# Patient Record
Sex: Female | Born: 1935 | Race: White | Hispanic: No
Health system: Southern US, Community
[De-identification: ages and names within clinical notes are randomized; demographics above are authoritative.]

## PROBLEM LIST (undated history)

## (undated) DIAGNOSIS — G629 Polyneuropathy, unspecified: Secondary | ICD-10-CM

## (undated) DIAGNOSIS — D689 Coagulation defect, unspecified: Secondary | ICD-10-CM

## (undated) DIAGNOSIS — I6529 Occlusion and stenosis of unspecified carotid artery: Secondary | ICD-10-CM

## (undated) DIAGNOSIS — K219 Gastro-esophageal reflux disease without esophagitis: Secondary | ICD-10-CM

## (undated) DIAGNOSIS — D369 Benign neoplasm, unspecified site: Secondary | ICD-10-CM

## (undated) DIAGNOSIS — I10 Essential (primary) hypertension: Secondary | ICD-10-CM

## (undated) DIAGNOSIS — M1990 Unspecified osteoarthritis, unspecified site: Secondary | ICD-10-CM

## (undated) DIAGNOSIS — M8580 Other specified disorders of bone density and structure, unspecified site: Secondary | ICD-10-CM

## (undated) DIAGNOSIS — I82409 Acute embolism and thrombosis of unspecified deep veins of unspecified lower extremity: Secondary | ICD-10-CM

## (undated) HISTORY — DX: Polyneuropathy, unspecified: G62.9

## (undated) HISTORY — DX: Unspecified osteoarthritis, unspecified site: M19.90

## (undated) HISTORY — PX: OTHER SURGICAL HISTORY: SHX169

## (undated) HISTORY — DX: Other specified disorders of bone density and structure, unspecified site: M85.80

## (undated) HISTORY — DX: Occlusion and stenosis of unspecified carotid artery: I65.29

## (undated) HISTORY — DX: Benign neoplasm, unspecified site: D36.9

## (undated) HISTORY — PX: CHOLECYSTECTOMY: SHX55

## (undated) HISTORY — DX: Gastro-esophageal reflux disease without esophagitis: K21.9

## (undated) HISTORY — PX: ABDOMINAL HYSTERECTOMY: SHX81

## (undated) HISTORY — DX: Essential (primary) hypertension: I10

## (undated) HISTORY — DX: Coagulation defect, unspecified: D68.9

---

## 2002-03-20 ENCOUNTER — Encounter: Payer: Self-pay | Admitting: Internal Medicine

## 2002-03-20 ENCOUNTER — Encounter: Admission: RE | Admit: 2002-03-20 | Discharge: 2002-03-20 | Payer: Self-pay | Admitting: Internal Medicine

## 2004-04-27 ENCOUNTER — Encounter: Admission: RE | Admit: 2004-04-27 | Discharge: 2004-05-24 | Payer: Self-pay | Admitting: Internal Medicine

## 2004-07-26 ENCOUNTER — Encounter: Admission: RE | Admit: 2004-07-26 | Discharge: 2004-07-26 | Payer: Self-pay | Admitting: Internal Medicine

## 2007-02-11 ENCOUNTER — Encounter: Admission: RE | Admit: 2007-02-11 | Discharge: 2007-02-11 | Payer: Self-pay | Admitting: Internal Medicine

## 2007-03-11 ENCOUNTER — Ambulatory Visit: Payer: Self-pay | Admitting: Vascular Surgery

## 2008-03-23 ENCOUNTER — Ambulatory Visit (HOSPITAL_COMMUNITY): Admission: RE | Admit: 2008-03-23 | Discharge: 2008-03-23 | Payer: Self-pay | Admitting: Internal Medicine

## 2008-05-17 ENCOUNTER — Emergency Department (HOSPITAL_COMMUNITY): Admission: EM | Admit: 2008-05-17 | Discharge: 2008-05-17 | Payer: Self-pay | Admitting: Emergency Medicine

## 2008-12-13 ENCOUNTER — Encounter: Admission: RE | Admit: 2008-12-13 | Discharge: 2008-12-13 | Payer: Self-pay | Admitting: Internal Medicine

## 2011-01-29 ENCOUNTER — Encounter
Admission: RE | Admit: 2011-01-29 | Discharge: 2011-01-29 | Payer: Self-pay | Source: Home / Self Care | Attending: Otolaryngology | Admitting: Otolaryngology

## 2011-02-05 ENCOUNTER — Other Ambulatory Visit: Payer: Self-pay | Admitting: Otolaryngology

## 2011-02-05 ENCOUNTER — Ambulatory Visit (HOSPITAL_BASED_OUTPATIENT_CLINIC_OR_DEPARTMENT_OTHER)
Admission: RE | Admit: 2011-02-05 | Discharge: 2011-02-05 | Disposition: A | Discharge status: Home / Self Care | Payer: MEDICARE | Source: Ambulatory Visit | Attending: Otolaryngology | Admitting: Otolaryngology

## 2011-02-05 DIAGNOSIS — D2320 Other benign neoplasm of skin of unspecified ear and external auricular canal: Secondary | ICD-10-CM | POA: Insufficient documentation

## 2011-02-12 NOTE — Op Note (Signed)
  NAME:  Kelly Mercer, Kelly Mercer             ACCOUNT NO.:  [id number]  MEDICAL RECORD NO.:  [id number]             PATIENT TYPE:  LOCATION:                                 FACILITY:  PHYSICIAN:  Joyous Gleghorn H. Pollyann Kennedy, MD          DATE OF BIRTH:  DATE OF PROCEDURE:  02/05/2011 DATE OF DISCHARGE:                              OPERATIVE REPORT   PREOPERATIVE DIAGNOSIS:  Left ear lobe cyst.  POSTOPERATIVE DIAGNOSIS:  Left ear lobe cyst.  PROCEDURE:  Excision of left ear lobe cyst with primary closure.  SURGEON:  Kaede Clendenen H. Pollyann Kennedy, MD  Local anesthesia was used.  No complications.  FINDINGS:  A 2-cm cyst in the left ear lobe at the attachment to the upper cervical skin.  Cyst contained straw-colored fluid and had a dark bluish type papule arising from the center of it on the skin.  No complications.  No blood loss.  HISTORY:  An 77 year old with an enlarging cyst in the left ear lobe. Risks, benefits, alternatives, complications of the procedure were explained to the patient who seemed to understand and agreed to surgery.  PROCEDURE:  The patient was taken to the operating room, placed on the operating table in supine position.  The left ear was prepped and draped in a standard fashion.  Xylocaine with epinephrine was infiltrated around the base of the earlobe in all directions for local anesthesia and vasoconstriction.  An ellipse of skin was excised overlying the papule.  During the incision, the cyst was inadvertently ruptured and straw-colored serous fluid was produced.  The dissection continued using scalpel, excising the entire mass.  Additional tissue that appeared to be part of the cyst was then excised as well from the deeper tissues.  The dissection continued until I was able to see subcutaneous fat all around.  The defect was then closed in layers using 4-0 chromic suture in the deep layer to reappose the skin.  The skin was then reapposed using Dermabond.  The patient was then  transferred to recovery in stable condition.     Adalena Abdulla H. Pollyann Kennedy, MD     JHR/MEDQ  D:  02/05/2011  T:  02/05/2011  Job:  [id number]  Electronically Signed by Serena Colonel MD on 02/12/2011 09:36:20 PM

## 2011-09-26 LAB — POCT I-STAT, CHEM 8
BUN: 33 — ABNORMAL HIGH
Calcium, Ion: 1.18
Chloride: 102
Creatinine, Ser: 1.3 — ABNORMAL HIGH
Glucose, Bld: 237 — ABNORMAL HIGH
TCO2: 24

## 2011-09-26 LAB — CBC
HCT: 39.1
Hemoglobin: 13.3
RBC: 4.31
WBC: 13.3 — ABNORMAL HIGH

## 2011-09-26 LAB — DIFFERENTIAL
Basophils Absolute: 0.1
Eosinophils Relative: 0
Lymphocytes Relative: 13
Lymphs Abs: 1.7
Monocytes Absolute: 0.6
Monocytes Relative: 4
Neutro Abs: 10.9 — ABNORMAL HIGH

## 2011-09-26 LAB — POCT CARDIAC MARKERS
Operator id: 280141
Troponin i, poc: <0.05

## 2011-11-12 ENCOUNTER — Ambulatory Visit: Payer: Medicare Other | Admitting: Gastroenterology

## 2013-04-20 ENCOUNTER — Encounter (INDEPENDENT_AMBULATORY_CARE_PROVIDER_SITE_OTHER): Payer: Medicare Other

## 2013-04-20 DIAGNOSIS — R609 Edema, unspecified: Secondary | ICD-10-CM

## 2013-04-20 DIAGNOSIS — M7989 Other specified soft tissue disorders: Secondary | ICD-10-CM

## 2013-04-30 DIAGNOSIS — I82409 Acute embolism and thrombosis of unspecified deep veins of unspecified lower extremity: Secondary | ICD-10-CM

## 2013-04-30 HISTORY — DX: Acute embolism and thrombosis of unspecified deep veins of unspecified lower extremity: I82.409

## 2013-05-10 ENCOUNTER — Inpatient Hospital Stay (HOSPITAL_COMMUNITY)
Admission: EM | Admit: 2013-05-10 | Discharge: 2013-05-12 | DRG: 356 | Disposition: A | Discharge status: Home / Self Care | Payer: Medicare Other | Attending: Internal Medicine | Admitting: Internal Medicine

## 2013-05-10 ENCOUNTER — Encounter (HOSPITAL_COMMUNITY): Payer: Self-pay | Admitting: Internal Medicine

## 2013-05-10 DIAGNOSIS — H9190 Unspecified hearing loss, unspecified ear: Secondary | ICD-10-CM | POA: Diagnosis present

## 2013-05-10 DIAGNOSIS — M899 Disorder of bone, unspecified: Secondary | ICD-10-CM | POA: Diagnosis present

## 2013-05-10 DIAGNOSIS — E119 Type 2 diabetes mellitus without complications: Secondary | ICD-10-CM

## 2013-05-10 DIAGNOSIS — K219 Gastro-esophageal reflux disease without esophagitis: Secondary | ICD-10-CM | POA: Diagnosis present

## 2013-05-10 DIAGNOSIS — I10 Essential (primary) hypertension: Secondary | ICD-10-CM

## 2013-05-10 DIAGNOSIS — D62 Acute posthemorrhagic anemia: Secondary | ICD-10-CM | POA: Diagnosis present

## 2013-05-10 DIAGNOSIS — K625 Hemorrhage of anus and rectum: Secondary | ICD-10-CM

## 2013-05-10 DIAGNOSIS — M1990 Unspecified osteoarthritis, unspecified site: Secondary | ICD-10-CM | POA: Diagnosis present

## 2013-05-10 DIAGNOSIS — K922 Gastrointestinal hemorrhage, unspecified: Secondary | ICD-10-CM

## 2013-05-10 DIAGNOSIS — R319 Hematuria, unspecified: Secondary | ICD-10-CM | POA: Diagnosis present

## 2013-05-10 DIAGNOSIS — Z79899 Other long term (current) drug therapy: Secondary | ICD-10-CM

## 2013-05-10 DIAGNOSIS — I82402 Acute embolism and thrombosis of unspecified deep veins of left lower extremity: Secondary | ICD-10-CM

## 2013-05-10 DIAGNOSIS — K5731 Diverticulosis of large intestine without perforation or abscess with bleeding: Secondary | ICD-10-CM | POA: Diagnosis present

## 2013-05-10 DIAGNOSIS — K649 Unspecified hemorrhoids: Principal | ICD-10-CM | POA: Diagnosis present

## 2013-05-10 DIAGNOSIS — I82409 Acute embolism and thrombosis of unspecified deep veins of unspecified lower extremity: Secondary | ICD-10-CM

## 2013-05-10 HISTORY — DX: Acute embolism and thrombosis of unspecified deep veins of unspecified lower extremity: I82.409

## 2013-05-10 LAB — CBC
HCT: 28.8 % — ABNORMAL LOW (ref 36.0–46.0)
HCT: 27.5 % — ABNORMAL LOW (ref 36.0–46.0)
Hemoglobin: 9.3 g/dL — ABNORMAL LOW (ref 12.0–15.0)
MCH: 31 pg (ref 26.0–34.0)
MCHC: 32.3 g/dL (ref 30.0–36.0)
MCV: 93.6 fL (ref 78.0–100.0)
Platelets: 265 10*3/uL (ref 150–400)
Platelets: 238 10*3/uL (ref 150–400)
RBC: 2.92 MIL/uL — ABNORMAL LOW (ref 3.87–5.11)
RDW: 13.4 % (ref 11.5–15.5)
RDW: 13.3 % (ref 11.5–15.5)
WBC: 7.1 10*3/uL (ref 4.0–10.5)
WBC: 6.3 10*3/uL (ref 4.0–10.5)
WBC: 5.1 10*3/uL (ref 4.0–10.5)

## 2013-05-10 LAB — COMPREHENSIVE METABOLIC PANEL
ALT: 8 U/L (ref 0–35)
ALT: 8 U/L (ref 0–35)
AST: 16 U/L (ref 0–37)
Albumin: 3.1 g/dL — ABNORMAL LOW (ref 3.5–5.2)
Albumin: 3 g/dL — ABNORMAL LOW (ref 3.5–5.2)
Alkaline Phosphatase: 26 U/L — ABNORMAL LOW (ref 39–117)
Calcium: 9.3 mg/dL (ref 8.4–10.5)
Chloride: 103 mEq/L (ref 96–112)
Chloride: 108 mEq/L (ref 96–112)
Creatinine, Ser: 1.18 mg/dL — ABNORMAL HIGH (ref 0.50–1.10)
Glucose, Bld: 89 mg/dL (ref 70–99)
Potassium: 3.3 mEq/L — ABNORMAL LOW (ref 3.5–5.1)
Sodium: 135 mEq/L (ref 135–145)
Sodium: 139 mEq/L (ref 135–145)
Total Protein: 5.6 g/dL — ABNORMAL LOW (ref 6.0–8.3)

## 2013-05-10 LAB — POCT I-STAT, CHEM 8
BUN: 54 mg/dL — ABNORMAL HIGH (ref 6–23)
Chloride: 107 mEq/L (ref 96–112)
Potassium: 3.5 mEq/L (ref 3.5–5.1)
Sodium: 139 mEq/L (ref 135–145)

## 2013-05-10 LAB — URINALYSIS, ROUTINE W REFLEX MICROSCOPIC
Glucose, UA: NEGATIVE mg/dL
Specific Gravity, Urine: 1.015 (ref 1.005–1.030)

## 2013-05-10 LAB — TYPE AND SCREEN
ABO/RH(D): O POS
Antibody Screen: NEGATIVE

## 2013-05-10 LAB — GLUCOSE, CAPILLARY: Glucose-Capillary: 121 mg/dL — ABNORMAL HIGH (ref 70–99)

## 2013-05-10 LAB — URINE MICROSCOPIC-ADD ON

## 2013-05-10 LAB — OCCULT BLOOD, POC DEVICE: Fecal Occult Bld: POSITIVE — AB

## 2013-05-10 LAB — MAGNESIUM: Magnesium: 2.1 mg/dL (ref 1.5–2.5)

## 2013-05-10 LAB — MRSA PCR SCREENING: MRSA by PCR: NEGATIVE

## 2013-05-10 LAB — ABO/RH: ABO/RH(D): O POS

## 2013-05-10 MED ORDER — ONDANSETRON HCL 4 MG/2ML IJ SOLN: 4.0000 mg | Freq: Four times a day (QID) | INTRAMUSCULAR | Status: DC | PRN

## 2013-05-10 MED ORDER — SODIUM CHLORIDE 0.9 % IJ SOLN
3.0000 mL | Freq: Two times a day (BID) | INTRAMUSCULAR | Status: DC
  Administered 2013-05-11: 3 mL via INTRAVENOUS

## 2013-05-10 MED ORDER — INSULIN ASPART 100 UNIT/ML ~~LOC~~ SOLN
0.0000 [IU] | SUBCUTANEOUS | Status: DC
  Administered 2013-05-11: 2 [IU] via SUBCUTANEOUS

## 2013-05-10 MED ORDER — PANTOPRAZOLE SODIUM 40 MG IV SOLR
40.0000 mg | Freq: Every day | INTRAVENOUS | Status: DC
  Administered 2013-05-10 – 2013-05-11 (×2): 40 mg via INTRAVENOUS
  Filled 2013-05-10 (×3): qty 40

## 2013-05-10 MED ORDER — ONDANSETRON HCL 4 MG PO TABS: 4.0000 mg | ORAL_TABLET | Freq: Four times a day (QID) | ORAL | Status: DC | PRN

## 2013-05-10 MED ORDER — ACETAMINOPHEN 650 MG RE SUPP: 650.0000 mg | Freq: Four times a day (QID) | RECTAL | Status: DC | PRN

## 2013-05-10 MED ORDER — PANTOPRAZOLE SODIUM 40 MG IV SOLR
40.0000 mg | Freq: Once | INTRAVENOUS | Status: AC
  Administered 2013-05-10: 40 mg via INTRAVENOUS
  Filled 2013-05-10: qty 40

## 2013-05-10 MED ORDER — HYDROCODONE-ACETAMINOPHEN 5-325 MG PO TABS
1.0000 | ORAL_TABLET | ORAL | Status: DC | PRN
  Administered 2013-05-10: 2 via ORAL
  Filled 2013-05-10: qty 2

## 2013-05-10 MED ORDER — PROPRANOLOL HCL 20 MG PO TABS
20.0000 mg | ORAL_TABLET | Freq: Two times a day (BID) | ORAL | Status: DC
  Administered 2013-05-10 – 2013-05-12 (×5): 20 mg via ORAL
  Filled 2013-05-10 (×6): qty 1

## 2013-05-10 MED ORDER — SODIUM CHLORIDE 0.9 % IV SOLN
INTRAVENOUS | Status: DC
  Administered 2013-05-10: 1000 mL via INTRAVENOUS
  Administered 2013-05-10 – 2013-05-12 (×5): via INTRAVENOUS

## 2013-05-10 MED ORDER — ACETAMINOPHEN 325 MG PO TABS: 650.0000 mg | ORAL_TABLET | Freq: Four times a day (QID) | ORAL | Status: DC | PRN

## 2013-05-10 MED ORDER — GABAPENTIN 100 MG PO CAPS
100.0000 mg | ORAL_CAPSULE | Freq: Three times a day (TID) | ORAL | Status: DC
  Administered 2013-05-10 – 2013-05-12 (×7): 100 mg via ORAL
  Filled 2013-05-10 (×9): qty 1

## 2013-05-10 NOTE — Progress Notes (Signed)
Patient ID: Kelly Mercer, female   DOB: [birth year], 79 y.o.   MRN: [id number] Request received for placement of an IVC filter in pt with hx of recently diagnosed LLE DVT, now with hematuria and rectal bleeding while anticoagulated. Additional PMH as below. Exam: pt awake/alert; chest- CTA bilat; heart- RRR; abd- soft,+BS,NT; ext- FROM, trace left LE edema.    Filed Vitals:   05/10/13 0700 05/10/13 0800 05/10/13 0900 05/10/13 1002  BP: 148/45 140/34 133/33 157/66  Pulse: 74 63 65 54  Temp:  98.1 F (36.7 C)  98.1 F (36.7 C)  TempSrc:  Oral  Oral  Resp: 15 21 13 16   Height:    5\' 4"  (1.626 m)  Weight:    136 lb 3.9 oz (61.8 kg)  SpO2: 100% 99% 99% 99%   Past Medical History  Diagnosis Date  . Hypertension   . Diabetes mellitus   . Osteoarthritis     r knee  . GERD (gastroesophageal reflux disease)   . Osteopenia   . Hearing loss   . Peripheral neuropathy   . Carotid stenosis     right  . Dermoid tumor     arm  . DVT (deep venous thrombosis) 04/30/2013   Past Surgical History  Procedure Laterality Date  . Abdominal hysterectomy    . Desmoid tumor resection    . Cataracts Bilateral   . Cholecystectomy      Results for orders placed during the hospital encounter of 05/10/13  MRSA PCR SCREENING      Result Value Range   MRSA by PCR NEGATIVE  NEGATIVE  CBC      Result Value Range   WBC 7.1  4.0 - 10.5 K/uL   RBC 3.13 (*) 3.87 - 5.11 MIL/uL   Hemoglobin 9.7 (*) 12.0 - 15.0 g/dL   HCT 40.9 (*) 81.1 - 91.4 %   MCV 93.6  78.0 - 100.0 fL   MCH 31.0  26.0 - 34.0 pg   MCHC 33.1  30.0 - 36.0 g/dL   RDW 78.2  95.6 - 21.3 %   Platelets 265  150 - 400 K/uL  COMPREHENSIVE METABOLIC PANEL      Result Value Range   Sodium 135  135 - 145 mEq/L   Potassium 3.5  3.5 - 5.1 mEq/L   Chloride 103  96 - 112 mEq/L   CO2 22  19 - 32 mEq/L   Glucose, Bld 103 (*) 70 - 99 mg/dL   BUN 53 (*) 6 - 23 mg/dL   Creatinine, Ser 0.86 (*) 0.50 - 1.10 mg/dL   Calcium 9.3  8.4 - 57.8 mg/dL   Total Protein 6.1  6.0 - 8.3 g/dL   Albumin 3.1 (*) 3.5 - 5.2 g/dL   AST 16  0 - 37 U/L   ALT 8  0 - 35 U/L   Alkaline Phosphatase 30 (*) 39 - 117 U/L   Total Bilirubin 0.4  0.3 - 1.2 mg/dL   GFR calc non Af Amer 41 (*) >90 mL/min   GFR calc Af Amer 48 (*) >90 mL/min  URINALYSIS, ROUTINE W REFLEX MICROSCOPIC      Result Value Range   Color, Urine YELLOW  YELLOW   APPearance CLEAR  CLEAR   Specific Gravity, Urine 1.015  1.005 - 1.030   pH 5.0  5.0 - 8.0   Glucose, UA NEGATIVE  NEGATIVE mg/dL   Hgb urine dipstick LARGE (*) NEGATIVE   Bilirubin Urine NEGATIVE  NEGATIVE  Ketones, ur NEGATIVE  NEGATIVE mg/dL   Protein, ur NEGATIVE  NEGATIVE mg/dL   Urobilinogen, UA 0.2  0.0 - 1.0 mg/dL   Nitrite NEGATIVE  NEGATIVE   Leukocytes, UA NEGATIVE  NEGATIVE  MAGNESIUM      Result Value Range   Magnesium 2.1  1.5 - 2.5 mg/dL  PHOSPHORUS      Result Value Range   Phosphorus 3.1  2.3 - 4.6 mg/dL  COMPREHENSIVE METABOLIC PANEL      Result Value Range   Sodium 139  135 - 145 mEq/L   Potassium 3.3 (*) 3.5 - 5.1 mEq/L   Chloride 108  96 - 112 mEq/L   CO2 21  19 - 32 mEq/L   Glucose, Bld 89  70 - 99 mg/dL   BUN 45 (*) 6 - 23 mg/dL   Creatinine, Ser 5.62  0.50 - 1.10 mg/dL   Calcium 8.9  8.4 - 13.0 mg/dL   Total Protein 5.6 (*) 6.0 - 8.3 g/dL   Albumin 3.0 (*) 3.5 - 5.2 g/dL   AST 15  0 - 37 U/L   ALT 8  0 - 35 U/L   Alkaline Phosphatase 26 (*) 39 - 117 U/L   Total Bilirubin 0.4  0.3 - 1.2 mg/dL   GFR calc non Af Amer 48 (*) >90 mL/min   GFR calc Af Amer 56 (*) >90 mL/min  CBC      Result Value Range   WBC 6.3  4.0 - 10.5 K/uL   RBC 3.06 (*) 3.87 - 5.11 MIL/uL   Hemoglobin 9.3 (*) 12.0 - 15.0 g/dL   HCT 86.5 (*) 78.4 - 69.6 %   MCV 94.1  78.0 - 100.0 fL   MCH 30.4  26.0 - 34.0 pg   MCHC 32.3  30.0 - 36.0 g/dL   RDW 29.5  28.4 - 13.2 %   Platelets 253  150 - 400 K/uL  URINE MICROSCOPIC-ADD ON      Result Value Range   Squamous Epithelial / LPF RARE  RARE   RBC / HPF 21-50  <3  RBC/hpf  OCCULT BLOOD, POC DEVICE      Result Value Range   Fecal Occult Bld POSITIVE (*) NEGATIVE  POCT I-STAT, CHEM 8      Result Value Range   Sodium 139  135 - 145 mEq/L   Potassium 3.5  3.5 - 5.1 mEq/L   Chloride 107  96 - 112 mEq/L   BUN 54 (*) 6 - 23 mg/dL   Creatinine, Ser 4.40 (*) 0.50 - 1.10 mg/dL   Glucose, Bld 99  70 - 99 mg/dL   Calcium, Ion 1.02  7.25 - 1.30 mmol/L   TCO2 22  0 - 100 mmol/L   Hemoglobin 9.9 (*) 12.0 - 15.0 g/dL   HCT 36.6 (*) 44.0 - 34.7 %  TYPE AND SCREEN      Result Value Range   ABO/RH(D) O POS     Antibody Screen NEG     Sample Expiration 05/13/2013    ABO/RH      Result Value Range   ABO/RH(D) O POS     A/P: Pt with hx recently diagnosed LLE DVT, now with hematuria/rectal bleeding while on anticoagulation. Plan is for placement of an IVC filter on 5/12. Details/risks of procedure d/w pt /son with their understanding and consent. Primary service aware of plans.

## 2013-05-10 NOTE — Progress Notes (Signed)
She arrives from ICU at this time awake, alert and in no distress accompanied by her son. She tells me she has had left leg swelling after tripping on a curb.  She was subsequently found to have DVT by her PCP (Dr. Waynard Edwards).  She was placed on Xarelto initially, which caused some hematuria.  She was then switched to Pradaxa, and after about three days of Pradaxa therapy, she developed grossly bloody stools. She denies any pain or discomfort.

## 2013-05-10 NOTE — ED Provider Notes (Signed)
History     CSN: [id number]  Arrival date & time 05/10/13  0004   First MD Initiated Contact with Patient 05/10/13 0053      Chief Complaint  Patient presents with  . GI Bleeding    (Consider location/radiation/quality/duration/timing/severity/associated sxs/prior treatment) HPI Hx per PT - on Pradaxa for DVT, recently switched from xarelto 2/2 hematuria.  Some BRB with wiping last few days and now tonight sig rectal bleeding, no rectal or ABD pain, no h/o GIB. Bleeding MOD in severity. No dizzy or near syncope. No CP or SOB. hematuria improved.    Past Medical History  Diagnosis Date  . Hypertension   . Diabetes mellitus   . Osteoarthritis     r knee  . GERD (gastroesophageal reflux disease)   . Osteopenia   . Hearing loss   . Peripheral neuropathy   . Carotid stenosis     right  . Dermoid tumor     arm    No past surgical history on file.  No family history on file.  History  Substance Use Topics  . Smoking status: Not on file  . Smokeless tobacco: Not on file  . Alcohol Use: Not on file    OB History   Grav Para Term Preterm Abortions TAB SAB Ect Mult Living                  Review of Systems  Constitutional: Negative for fever and chills.  HENT: Negative for neck pain and neck stiffness.   Eyes: Negative for pain.  Respiratory: Negative for shortness of breath.   Cardiovascular: Negative for chest pain.  Gastrointestinal: Positive for anal bleeding. Negative for vomiting, abdominal pain, diarrhea and constipation.  Genitourinary: Negative for dysuria.  Musculoskeletal: Negative for back pain.  Skin: Negative for rash.  Neurological: Negative for headaches.  All other systems reviewed and are negative.    Allergies  Review of patient's allergies indicates not on file.  Home Medications   Current Outpatient Rx  Name  Route  Sig  Dispense  Refill  . dabigatran (PRADAXA) 75 MG CAPS   Oral   Take 75 mg by mouth every 12 (twelve) hours.        . fenofibrate 160 MG tablet   Oral   Take 160 mg by mouth daily.           Marland Kitchen gabapentin (NEURONTIN) 100 MG capsule   Oral   Take 100 mg by mouth 3 (three) times daily.         Marland Kitchen losartan (COZAAR) 50 MG tablet   Oral   Take 100 mg by mouth daily.         . propranolol-hydrochlorothiazide (INDERIDE) 80-25 MG per tablet   Oral   Take 1 tablet by mouth daily.           . rivaroxaban (XARELTO) 10 MG TABS tablet   Oral   Take 10 mg by mouth daily.           BP 153/56  Pulse 66  Temp(Src) 97.8 F (36.6 C) (Oral)  Resp 16  SpO2 100%  Physical Exam  Constitutional: She is oriented to person, place, and time. She appears well-developed and well-nourished.  HENT:  Head: Normocephalic and atraumatic.  Mouth/Throat: Oropharynx is clear and moist.  Eyes: Conjunctivae and EOM are normal. Pupils are equal, round, and reactive to light.  Neck: Neck supple.  Cardiovascular: Normal rate, regular rhythm and intact distal pulses.   Pulmonary/Chest: Effort  normal and breath sounds normal. No respiratory distress.  Abdominal: Soft. Bowel sounds are normal. She exhibits no distension. There is no tenderness. There is no rebound and no guarding.  Genitourinary:  Rectal: BRB fingertip. no tenderness, no fissure  Musculoskeletal: Normal range of motion. She exhibits no edema.  Neurological: She is alert and oriented to person, place, and time.  Skin: Skin is warm and dry. No pallor.    ED Course  Procedures (including critical care time)  Results for orders placed during the hospital encounter of 05/10/13  CBC      Result Value Range   WBC 7.1  4.0 - 10.5 K/uL   RBC 3.13 (*) 3.87 - 5.11 MIL/uL   Hemoglobin 9.7 (*) 12.0 - 15.0 g/dL   HCT 78.2 (*) 95.6 - 21.3 %   MCV 93.6  78.0 - 100.0 fL   MCH 31.0  26.0 - 34.0 pg   MCHC 33.1  30.0 - 36.0 g/dL   RDW 08.6  57.8 - 46.9 %   Platelets 265  150 - 400 K/uL  COMPREHENSIVE METABOLIC PANEL      Result Value Range   Sodium 135   135 - 145 mEq/L   Potassium 3.5  3.5 - 5.1 mEq/L   Chloride 103  96 - 112 mEq/L   CO2 22  19 - 32 mEq/L   Glucose, Bld 103 (*) 70 - 99 mg/dL   BUN 53 (*) 6 - 23 mg/dL   Creatinine, Ser 6.29 (*) 0.50 - 1.10 mg/dL   Calcium 9.3  8.4 - 52.8 mg/dL   Total Protein 6.1  6.0 - 8.3 g/dL   Albumin 3.1 (*) 3.5 - 5.2 g/dL   AST 16  0 - 37 U/L   ALT 8  0 - 35 U/L   Alkaline Phosphatase 30 (*) 39 - 117 U/L   Total Bilirubin 0.4  0.3 - 1.2 mg/dL   GFR calc non Af Amer 41 (*) >90 mL/min   GFR calc Af Amer 48 (*) >90 mL/min  OCCULT BLOOD, POC DEVICE      Result Value Range   Fecal Occult Bld POSITIVE (*) NEGATIVE  POCT I-STAT, CHEM 8      Result Value Range   Sodium 139  135 - 145 mEq/L   Potassium 3.5  3.5 - 5.1 mEq/L   Chloride 107  96 - 112 mEq/L   BUN 54 (*) 6 - 23 mg/dL   Creatinine, Ser 4.13 (*) 0.50 - 1.10 mg/dL   Glucose, Bld 99  70 - 99 mg/dL   Calcium, Ion 2.44  0.10 - 1.30 mmol/L   TCO2 22  0 - 100 mmol/L   Hemoglobin 9.9 (*) 12.0 - 15.0 g/dL   HCT 27.2 (*) 53.6 - 64.4 %  TYPE AND SCREEN      Result Value Range   ABO/RH(D) O POS     Antibody Screen PENDING     Sample Expiration 05/13/2013    ABO/RH      Result Value Range   ABO/RH(D) O POS     IVFS. IV protonix. Labs  2:30 AM d/w Dr Adela Glimpse, plan MED admit  MDM  GI Bleeding on Pradaxa  Labs. IVFs and PPI   MED admit        Sunnie Nielsen, MD 05/10/13 [phone]

## 2013-05-10 NOTE — H&P (Signed)
PCP: Ezequiel Kayser, MD    Chief Complaint:  Blood in stool  HPI: Kelly Mercer is a 79 y.o. female   has a past medical history of Hypertension; Diabetes mellitus; Osteoarthritis; GERD (gastroesophageal reflux disease); Osteopenia; Hearing loss; Peripheral neuropathy; Carotid stenosis; Dermoid tumor; and DVT (deep venous thrombosis) (04/30/2013).   Presented with  1 month ago she fell and had developed swelling in Left leg. On 04/30/2013 she had a doppler showing DVT. She was initially started on xarelto. But 1 week ago she started to have hematuria her medication was changed to pradaxa and was sent to Urology for further work up. She is scheduled to have CT scan sometime soon to further evaluate this. For the past few days she started to have blood on the tissue when she wipes. On 5/10 she started to have severe ractal bleeding. Every time she stood up some blood would come out from her rectum. She has filled th commode twice with blood. In ER rectal exam showing bright red blood in the rectum. Her last bloody bowel movement was about 3 hours ago. She denies feeling light headed. Denies any chest pain or shortness of breath. She has hx of some hemorrhoids in the past. She never had a colonoscopy. Denies any abdominal pain, no melena no hematemesis.  Hospitalist called for an admission.   Review of Systems:    Pertinent positives include: blood in stool, left leg swelling  Constitutional:  No weight loss, night sweats, Fevers, chills, fatigue, weight loss  HEENT:  No headaches, Difficulty swallowing,Tooth/dental problems, Sore throat No sneezing, itching, ear ache, nasal congestion, post nasal drip,  Cardio-vascular:  No chest pain, Orthopnea, PND, anasarca, dizziness, palpitations.no Bilateral lower extremity swelling  GI:  No heartburn, indigestion, abdominal pain, nausea, vomiting, diarrhea, change in bowel habits, loss of appetite, melena, hematemesis Resp:  no shortness of breath at  rest. No dyspnea on exertion, No excess mucus, no productive cough, No non-productive cough, No coughing up of blood.No change in color of mucus.No wheezing. Skin:  no rash or lesions. No jaundice GU:  no dysuria, change in color of urine, no urgency or frequency. No straining to urinate.  No flank pain.  Musculoskeletal:  No joint pain or no joint swelling. No decreased range of motion. No back pain.  Psych:  No change in mood or affect. No depression or anxiety. No memory loss.  Neuro: no localizing neurological complaints, no tingling, no weakness, no double vision, no gait abnormality, no slurred speech, no confusion  Otherwise ROS are negative except for above, 10 systems were reviewed  Past Medical History: Past Medical History  Diagnosis Date  . Hypertension   . Diabetes mellitus   . Osteoarthritis     r knee  . GERD (gastroesophageal reflux disease)   . Osteopenia   . Hearing loss   . Peripheral neuropathy   . Carotid stenosis     right  . Dermoid tumor     arm  . DVT (deep venous thrombosis) 04/30/2013   Past Surgical History  Procedure Laterality Date  . Abdominal hysterectomy    . Desmoid tumor resection    . Cataracts Bilateral   . Cholecystectomy       Medications: Prior to Admission medications   Medication Sig Start Date End Date Taking? Authorizing Provider  dabigatran (PRADAXA) 75 MG CAPS Take 75 mg by mouth every 12 (twelve) hours.   Yes Historical Provider, MD  fenofibrate 160 MG tablet Take 160 mg by  mouth daily.     Yes Historical Provider, MD  gabapentin (NEURONTIN) 100 MG capsule Take 100 mg by mouth 3 (three) times daily.   Yes Historical Provider, MD  losartan (COZAAR) 50 MG tablet Take 100 mg by mouth daily.   Yes Historical Provider, MD  propranolol-hydrochlorothiazide (INDERIDE) 80-25 MG per tablet Take 1 tablet by mouth daily.     Yes Historical Provider, MD    Allergies:  No Known Allergies  Social History:  Ambulatory  independently   Lives at  Home alone  Helana Macbride [phone]  Son Shilpa Bushee [phone] cell [phone] home   reports that she has never smoked. She does not have any smokeless tobacco history on file. She reports that she does not drink alcohol or use illicit drugs.   Family History: family history includes Diabetes in her mother and Stroke in her father.    Physical Exam: Patient Vitals for the past 24 hrs:  BP Temp Temp src Pulse Resp SpO2  05/10/13 0023 153/56 mmHg 97.8 F (36.6 C) Oral 66 16 100 %    1. General:  in No Acute distress 2. Psychological: Alert and   Oriented 3. Head/ENT:   Moist   Mucous Membranes                          Head Non traumatic, neck supple                          Normal   Dentition 4. SKIN:   decreased Skin turgor,  Skin clean Dry and intact no rash, pale appearing.  5. Heart: Regular rate and rhythm no Murmur, Rub or gallop 6. Lungs: Clear to auscultation bilaterally, no wheezes or crackles   7. Abdomen: Soft, non-tender, Non distended 8. Lower extremities: no clubbing, cyanosis, or edema 9. Neurologically Grossly intact, moving all 4 extremities equally 10. MSK: Normal range of motion  body mass index is unknown because there is no height or weight on file.   Labs on Admission:   Recent Labs  05/10/13 0125 05/10/13 0152  NA 135 139  K 3.5 3.5  CL 103 107  CO2 22  --   GLUCOSE 103* 99  BUN 53* 54*  CREATININE 1.18* 1.20*  CALCIUM 9.3  --     Recent Labs  05/10/13 0125  AST 16  ALT 8  ALKPHOS 30*  BILITOT 0.4  PROT 6.1  ALBUMIN 3.1*   No results found for this basename: LIPASE, AMYLASE,  in the last 72 hours  Recent Labs  05/10/13 0125 05/10/13 0152  WBC 7.1  --   HGB 9.7* 9.9*  HCT 29.3* 29.0*  MCV 93.6  --   PLT 265  --    No results found for this basename: CKTOTAL, CKMB, CKMBINDEX, TROPONINI,  in the last 72 hours No results found for this basename: TSH, T4TOTAL, FREET3, T3FREE, THYROIDAB,  in the last  72 hours No results found for this basename: VITAMINB12, FOLATE, FERRITIN, TIBC, IRON, RETICCTPCT,  in the last 72 hours No results found for this basename: HGBA1C    CrCl is unknown because there is no height on file for the current visit. ABG    Component Value Date/Time   TCO2 22 05/10/2013 0152     No results found for this basename: DDIMER    Cultures: No results found for this basename: sdes, specrequest, cult, reptstatus  Radiological Exams on Admission: No results found.  Chart has been reviewed  Assessment/Plan   79 yo F with no prior hx of GI bleed currently on Pradaxa for recent DVT presents with likely Lower GI Bleed  Present on Admission:  . Bright red blood per rectum - spoke to [facility] on the phone, will see patient in early AM. For now serial CBC, NPO, IVF. Patient has been type and screened. At this point no indication for transfusion but if continues to have severe bleeding or significant drop in Hg will transfuse. Stop Pradaxa.  Marland Kitchen DVT (deep venous thrombosis) - stop pradaxa given active GI bleed. Patient may need IVC filter placement given very recent DVT. Will need IR consult in AM. . Hypertension - hold home meds. Will continue propranolol at lower dose with holding parameters. . Diabetes mellitus - diet controlled will monitor blood sugars while NPO   Prophylaxis: SCD  Protonix  CODE STATUS: FULL CODE   Other plan as per orders.  I have spent a total of 65 min on this admission time taken to discuss her care with pharmacy, [facility]  [facility] 05/10/2013, 2:35 AM

## 2013-05-10 NOTE — ED Notes (Addendum)
Bleeding started on Friday morning from rectum and has progressively gotten worse. Niece spoke - pcp- dr Darcus Austin on call Dr. Lorain Childes. Recommended to come in to Ed. Last bout of bleeding was large amount per patient tonight. Patient described dripping from rectum after wiping from using the bathroom. Bright red blood. Toilet filled with blood, no noted clots. Patient is taking Pardexa hx of DVT.

## 2013-05-10 NOTE — Consult Note (Signed)
Referring Provider: Dr. Adela Glimpse Primary Care Physician:  Ezequiel Kayser, MD Primary Gastroenterologist:  Gentry Fitz  Reason for Consultation:  Rectal bleeding  HPI: Kelly Mercer is a 79 y.o. female who had the acute onset of painless rectal bleeding described as red blood with clots that started on 04/1013 and several days before that she was having blood on the toilet tissue. Reports having hematuria prior to the onset of the rectal bleeding. She had red blood and clots this morning seen by the nurse without any stool with it. Denies any previous history of GI bleeding. Denies any associated abdominal pain/N/V/melena/hematemesis. Denies dizziness or lightheadedness. She has never had a colonoscopy. Son at bedside.  Developed hematuria while on Xarelto for a DVT and was changed to Pradaxa. Has filled the commode twice with blood. Had BRBPR on exam in ER. Reports history of hemorrhoids. Denies any associated rectal pain, burning, or itching. Denies dysuria. Has been having increased urinary frequency.   Past Medical History  Diagnosis Date  . Hypertension   . Diabetes mellitus   . Osteoarthritis     r knee  . GERD (gastroesophageal reflux disease)   . Osteopenia   . Hearing loss   . Peripheral neuropathy   . Carotid stenosis     right  . Dermoid tumor     arm  . DVT (deep venous thrombosis) 04/30/2013    Past Surgical History  Procedure Laterality Date  . Abdominal hysterectomy    . Desmoid tumor resection    . Cataracts Bilateral   . Cholecystectomy      Prior to Admission medications   Medication Sig Start Date End Date Taking? Authorizing Provider  dabigatran (PRADAXA) 75 MG CAPS Take 75 mg by mouth every 12 (twelve) hours.   Yes Historical Provider, MD  fenofibrate 160 MG tablet Take 160 mg by mouth daily.     Yes Historical Provider, MD  gabapentin (NEURONTIN) 100 MG capsule Take 100 mg by mouth 3 (three) times daily.   Yes Historical Provider, MD  losartan (COZAAR) 50  MG tablet Take 100 mg by mouth daily.   Yes Historical Provider, MD  propranolol-hydrochlorothiazide (INDERIDE) 80-25 MG per tablet Take 1 tablet by mouth daily.     Yes Historical Provider, MD    Scheduled Meds: . gabapentin  100 mg Oral TID  . insulin aspart  0-9 Units Subcutaneous Q4H  . pantoprazole (PROTONIX) IV  40 mg Intravenous QHS  . propranolol  20 mg Oral BID  . sodium chloride  3 mL Intravenous Q12H   Continuous Infusions: . sodium chloride 1,000 mL (05/10/13 0212)   PRN Meds:.acetaminophen, acetaminophen, HYDROcodone-acetaminophen, ondansetron (ZOFRAN) IV, ondansetron  Allergies as of 05/10/2013  . (No Known Allergies)    Family History  Problem Relation Age of Onset  . Diabetes Mother   . Stroke Father     History   Social History  . Marital Status: Widowed    Spouse Name: N/A    Number of Children: N/A  . Years of Education: N/A   Occupational History  . Not on file.   Social History Main Topics  . Smoking status: Never Smoker   . Smokeless tobacco: Not on file  . Alcohol Use: No  . Drug Use: No  . Sexually Active: Not on file   Other Topics Concern  . Not on file   Social History Narrative  . No narrative on file    Review of Systems: All negative except as stated above in  HPI.  Physical Exam: Vital signs: Filed Vitals:   05/10/13 0900  BP: 133/33  Pulse: 65  Temp: 98.1  Resp: 13   Last BM Date: 05/10/13 General:   Elderly, Alert,  Well-developed, well-nourished, pleasant and cooperative in NAD HEENT: anicteric Neck: supple, nontender Lungs:  Clear throughout to auscultation.   No wheezes, crackles, or rhonchi. No acute distress. Heart:  Regular rate and rhythm; no murmurs, clicks, rubs,  or gallops. Abdomen: soft, nontender, nondistended, +BS  Rectal:  Deferred Ext: no edema  GI:  Lab Results:  Recent Labs  05/10/13 0125 05/10/13 0152 05/10/13 0701  WBC 7.1  --  6.3  HGB 9.7* 9.9* 9.3*  HCT 29.3* 29.0* 28.8*  PLT 265   --  253   BMET  Recent Labs  05/10/13 0125 05/10/13 0152 05/10/13 0701  NA 135 139 139  K 3.5 3.5 3.3*  CL 103 107 108  CO2 22  --  21  GLUCOSE 103* 99 89  BUN 53* 54* 45*  CREATININE 1.18* 1.20* 1.04  CALCIUM 9.3  --  8.9   LFT  Recent Labs  05/10/13 0701  PROT 5.6*  ALBUMIN 3.0*  AST 15  ALT 8  ALKPHOS 26*  BILITOT 0.4   PT/INR No results found for this basename: LABPROT, INR,  in the last 72 hours   Studies/Results: No results found.  Impression/Plan: 79 yo with painless rectal bleeding that is likely diverticular in origin. BUN elevated but hemodynamically stable and no upper tract symptoms so I do not think this is due to a peptic ulcer and suspect that is due to dehydration with the elevated Cr. Follow H/Hs closely. Large blood on urine without bacteria is concerning and needs urology eval but defer to [facility] for timing of this. May need a noncontrast CT of abd/pelvis if hematuria and hematochezia persisting to see if a urologic source for both. I do NOT think she needs a colonoscopy at this time. Will start clear liquids. Would not advance today. Will follow. Thank you for this consultation.    LOS: 0 days   Caryle Helgeson C.  05/10/2013, 9:45 AM

## 2013-05-10 NOTE — ED Notes (Signed)
Unsuccessful iv attempt x2

## 2013-05-10 NOTE — ED Notes (Signed)
Pt fell about a month ago.  Had DVT dx.  Went on blood thinners.  Thinners were changed d/t hematuria.  Now since Friday, pt has had rectal bleed.  Scant on tissure before tonight.  Now blood is "pooring out".

## 2013-05-10 NOTE — Progress Notes (Signed)
Patient admitted earlier today for rectal bleeding. She has a prior history of DVT and has been maintained on anticoagulation with pradaxa. She had recently been switched from xarelto to pradaxa because of hematuria. GI consultation is pending for this morning. I think she is too high risk for anticoagulation. Have discussed with patient and her son placement of an IVC filter which they have agreed to. Hemoglobin has been stable. Go she is stable for transfer to telemetry. We'll continue to follow.  Peggye Pitt, MD [facility] Pager: [phone]

## 2013-05-11 ENCOUNTER — Inpatient Hospital Stay (HOSPITAL_COMMUNITY): Payer: Medicare Other

## 2013-05-11 DIAGNOSIS — K922 Gastrointestinal hemorrhage, unspecified: Secondary | ICD-10-CM

## 2013-05-11 HISTORY — PX: OTHER SURGICAL HISTORY: SHX169

## 2013-05-11 LAB — CBC
HCT: 24.8 % — ABNORMAL LOW (ref 36.0–46.0)
MCHC: 31.5 g/dL (ref 30.0–36.0)
MCV: 95 fL (ref 78.0–100.0)
Platelets: 207 10*3/uL (ref 150–400)
RDW: 13.5 % (ref 11.5–15.5)

## 2013-05-11 LAB — BASIC METABOLIC PANEL
BUN: 20 mg/dL (ref 6–23)
Creatinine, Ser: 0.88 mg/dL (ref 0.50–1.10)
GFR calc Af Amer: 68 mL/min — ABNORMAL LOW (ref >90)
GFR calc non Af Amer: 59 mL/min — ABNORMAL LOW (ref >90)

## 2013-05-11 LAB — GLUCOSE, CAPILLARY
Glucose-Capillary: 80 mg/dL (ref 70–99)
Glucose-Capillary: 81 mg/dL (ref 70–99)
Glucose-Capillary: 71 mg/dL (ref 70–99)
Glucose-Capillary: 73 mg/dL (ref 70–99)
Glucose-Capillary: 106 mg/dL — ABNORMAL HIGH (ref 70–99)

## 2013-05-11 MED ORDER — IOHEXOL 300 MG/ML  SOLN
50.0000 mL | Freq: Once | INTRAMUSCULAR | Status: AC | PRN
  Administered 2013-05-11: 1 mL via INTRAVENOUS

## 2013-05-11 MED ORDER — FENTANYL CITRATE 0.05 MG/ML IJ SOLN
INTRAMUSCULAR | Status: AC | PRN
  Administered 2013-05-11: 50 ug via INTRAVENOUS

## 2013-05-11 MED ORDER — MIDAZOLAM HCL 2 MG/2ML IJ SOLN
INTRAMUSCULAR | Status: AC | PRN
  Administered 2013-05-11: 0.5 mg via INTRAVENOUS

## 2013-05-11 NOTE — Progress Notes (Signed)
Nutrition Brief Note  Patient identified on the Malnutrition Screening Tool (MST) Report  Body mass index is 23.37 kg/(m^2). Patient meets criteria for normal weight based on current BMI. Pt reports that her usually body weight is around 126 lbs; weight was 136 lbs on 5/11. Pt reports that she weighed 184 lbs 3 years ago and intentionally lost weight to better control her diabetes by eating fewer sweets and carbohydrates.  Current diet order is Regular, patient is consuming approximately 100% of meals at this time. Pt reports that her appetite is good and she was eating well PTA. Labs and medications reviewed.  Lab Results  Component Value Date   HGBA1C 5.3 05/10/2013   No nutrition interventions warranted at this time. If nutrition issues arise, please consult RD.   Ian Malkin RD, LDN Inpatient Clinical Dietitian Pager: [phone] After Hours Pager: [phone]

## 2013-05-11 NOTE — Progress Notes (Addendum)
Patient ID: Kelly Mercer, female   DOB: 02/26/1926, 79 y.o.   MRN: [id number]  [facility] Gastroenterology Progress Note  Kelly Mercer 79 y.o. 1926/08/15   Subjective: Small brown and red stool reported last night. Small brown nonbloody stool this AM.   More alert and feel ok. Just back from IVC filter placement.  Objective: Vital signs: Filed Vitals:   05/11/13 1015  BP: 128/49  Pulse: 58  Temp: 98.1 F (36.7 C)  Resp: 12    Physical Exam: Gen: alert, no acute distress  Abd: soft, NT, ND, +BS  Lab Results:  Recent Labs  05/10/13 0152 05/10/13 0701 05/11/13 0455  NA 139 139 139  K 3.5 3.3* 3.5  CL 107 108 113*  CO2  --  21 22  GLUCOSE 99 89 83  BUN 54* 45* 20  CREATININE 1.20* 1.04 0.88  CALCIUM  --  8.9 8.4  MG  --  2.1  --   PHOS  --  3.1  --     Recent Labs  05/10/13 0125 05/10/13 0701  AST 16 15  ALT 8 8  ALKPHOS 30* 26*  BILITOT 0.4 0.4  PROT 6.1 5.6*  ALBUMIN 3.1* 3.0*    Recent Labs  05/10/13 1651 05/11/13 0455  WBC 5.1 4.2  HGB 9.0* 7.8*  HCT 27.5* 24.8*  MCV 94.2 95.0  PLT 238 207      Assessment/Plan: S/P GI bleed likely diverticular that appears to be resolving. Ok to eat from GI standpoint but defer to [facility] due to recent rads procedure. Continue supportive care. Will sign off. Call if questions.   Kelly Dawn C. 05/11/2013, 10:23 AM

## 2013-05-11 NOTE — Progress Notes (Signed)
[facility] PROGRESS NOTE  Kelly Mercer MWN:[id number] DOB: Dec 27, 1926 DOA: 05/10/2013 PCP: Kelly Kayser, MD  Assessment/Plan: Rectal bleeding -Suspect diverticular/hemorrhoidal in face of anticoagulation. -Anticoagulation has been discontinued. -Appreciate GI consultation, no colonoscopy planned at this point. -Bleeding seems to have slowed down.  Acute blood loss anemia  -Secondary to rectal bleeding. -Hemoglobin down to 7.8 today. -Will observe until tomorrow and recheck in the morning. Transfusion threshold is less than 7.  Recent left lower extremity DVT  -IVC filter has been placed due to risk of bleeding with anticoagulation.  Diabetes mellitus -Well controlled.  Code Status: Full Family Communication: None today  Disposition Plan: Potential home in the morning   Consultants:  GI, Dr. Bosie Mercer  Interventional radiology, Dr. watts   Antibiotics:  None   Subjective: Feels well. No complaints.  Objective: Filed Vitals:   05/11/13 1015 05/11/13 1030 05/11/13 1059 05/11/13 1115  BP: 128/49 129/42 140/41 143/44  Pulse: 58 60 58 55  Temp: 98.1 F (36.7 C)  97.2 F (36.2 C)   TempSrc: Oral  Oral   Resp: 12 14 16 14   Height:      Weight:      SpO2: 100%  99%     Intake/Output Summary (Last 24 hours) at 05/11/13 1125 Last data filed at 05/11/13 0600  Gross per 24 hour  Intake   1340 ml  Output    600 ml  Net    740 ml   Filed Weights   05/10/13 0415 05/10/13 1002  Weight: 58.4 kg (128 lb 12 oz) 61.8 kg (136 lb 3.9 oz)    Exam:   General:  Alert, awake, oriented x3, in no distress.  Cardiovascular: Regular rate and rhythm  Respiratory: Clear to auscultation bilaterally  Abdomen: Soft, nontender, nondistended, positive bowel sounds  Extremities: No clubbing, cyanosis or edema, positive pulses   Neurologic:  Grossly intact and nonfocal  Data Reviewed: Basic Metabolic Panel:  Recent Labs Lab 05/10/13 0125 05/10/13 0152  05/10/13 0701 05/11/13 0455  NA 135 139 139 139  K 3.5 3.5 3.3* 3.5  CL 103 107 108 113*  CO2 22  --  21 22  GLUCOSE 103* 99 89 83  BUN 53* 54* 45* 20  CREATININE 1.18* 1.20* 1.04 0.88  CALCIUM 9.3  --  8.9 8.4  MG  --   --  2.1  --   PHOS  --   --  3.1  --    Liver Function Tests:  Recent Labs Lab 05/10/13 0125 05/10/13 0701  AST 16 15  ALT 8 8  ALKPHOS 30* 26*  BILITOT 0.4 0.4  PROT 6.1 5.6*  ALBUMIN 3.1* 3.0*   No results found for this basename: LIPASE, AMYLASE,  in the last 168 hours No results found for this basename: AMMONIA,  in the last 168 hours CBC:  Recent Labs Lab 05/10/13 0125 05/10/13 0152 05/10/13 0701 05/10/13 1651 05/11/13 0455  WBC 7.1  --  6.3 5.1 4.2  HGB 9.7* 9.9* 9.3* 9.0* 7.8*  HCT 29.3* 29.0* 28.8* 27.5* 24.8*  MCV 93.6  --  94.1 94.2 95.0  PLT 265  --  253 238 207   Cardiac Enzymes: No results found for this basename: CKTOTAL, CKMB, CKMBINDEX, TROPONINI,  in the last 168 hours BNP (last 3 results) No results found for this basename: PROBNP,  in the last 8760 hours CBG:  Recent Labs Lab 05/10/13 1622 05/10/13 2049 05/11/13 0040 05/11/13 0509 05/11/13 0712  GLUCAP 106* 106* 81  80 81    Recent Results (from the past 240 hour(s))  MRSA PCR SCREENING     Status: None   Collection Time    05/10/13  4:14 AM      Result Value Range Status   MRSA by PCR NEGATIVE  NEGATIVE Final   Comment:            The GeneXpert MRSA Assay (FDA     approved for NASAL specimens     only), is one component of a     comprehensive MRSA colonization     surveillance program. It is not     intended to diagnose MRSA     infection nor to guide or     monitor treatment for     MRSA infections.     Studies: Ir Ivc Filter Plmt / S&i /img Guid/mod Sed  05/11/2013  *RADIOLOGY REPORT*  Indication: Left lower extremity DVT, now with rectal bleeding while on anticoagulation.  ULTRASOUND GUIDANCE FOR VASCULAR ACCESS IVC CATHETERIZATION AND VENOGRAM IVC  FILTER INSERTION  Medications: Fentanyl 50 mcg IV; Versed 0.5 mg IV  Contrast: 30 mL Omnipaque-300  Sedation time: 15 minutes  Fluoroscopy time: 1-minute, 20-seconds  Complications: None immediate  PROCEDURE:  Informed written consent was obtained from the patient following explanation of the procedure, risks, benefits and alternatives.  A time out was performed prior to the initiation of the procedure. Maximal barrier sterile technique utilized including caps, mask, sterile gowns, sterile gloves, large sterile drape, hand hygiene, and Betadine prep.  Under sterile condition and local anesthesia, right internal jugular venous access was performed with ultrasound.  An ultrasound image was saved and sent to PACS.  Over a guide wire, the IVC filter delivery sheath and inner dilator were advanced into the IVC just above the IVC bifurcation.  Contrast injection was performed for an IVC venogram.  Through the delivery sheath, a retrievable Denali IVC filter was deployed below the level of the renal veins and above the IVC bifurcation.  Limited post deployment venacavagram was performed.  The delivery sheath was removed and hemostasis was obtained with manual compression.  A dressing was placed.  The patient tolerated the procedure well without immediate post procedural complication.  Findings:  The IVC is patent without evidence of thrombus, stenosis, or occlusion.  No variant venous anatomy.   Successful placement of the IVC filter below the level of the renal veins.  IMPRESSION:  Successful ultrasound and fluoroscopic guided placement of an infrarenal retrievable IVC filter via right jugular approach.   Original Report Authenticated By: Kelly Ruiz, MD     Scheduled Meds: . gabapentin  100 mg Oral TID  . insulin aspart  0-9 Units Subcutaneous Q4H  . pantoprazole (PROTONIX) IV  40 mg Intravenous QHS  . propranolol  20 mg Oral BID  . sodium chloride  3 mL Intravenous Q12H   Continuous Infusions: . sodium  chloride 125 mL/hr at 05/11/13 1112    Active Problems:   Bright red blood per rectum   DVT (deep venous thrombosis)   Hypertension   Diabetes mellitus    Time spent: 35 minutes    Kelly Mercer  [facility] Pager [phone]  If 7PM-7AM, please contact night-coverage at [email or web address], password [facility] 05/11/2013, 11:25 AM  LOS: 1 day

## 2013-05-11 NOTE — Procedures (Signed)
Successful placement of an infrarenal IVC filter.  No immediate complications.  

## 2013-05-11 NOTE — Care Management Note (Signed)
    Page 1 of 1   05/11/2013     2:39:15 PM   CARE MANAGEMENT NOTE 05/11/2013  Patient:  Kelly Mercer, Kelly Mercer   Account Number:  [id number]  Date Initiated:  05/11/2013  Documentation initiated by:  Lanier Clam  Subjective/Objective Assessment:   ADMITTED W/BLOOD IN STOOL.HX:DM,GERD,DVT.     Action/Plan:   FROM HOME ALONE.HAS PCP,PHARMACY.   Anticipated DC Date:  05/14/2013   Anticipated DC Plan:  HOME W HOME HEALTH SERVICES      DC Planning Services  CM consult      Choice offered to / List presented to:             Status of service:  In process, will continue to follow Medicare Important Message given?   (If response is "NO", the following Medicare IM given date fields will be blank) Date Medicare IM given:   Date Additional Medicare IM given:    Discharge Disposition:    Per UR Regulation:  Reviewed for med. necessity/level of care/duration of stay  If discussed at Long Length of Stay Meetings, dates discussed:    Comments:  05/11/13 Ahja Martello RN,BSN NCM [phone] RECOMMEND PT/OT CONS WHEN MD FEEL APPROPRIATE.

## 2013-05-12 LAB — CBC
HCT: 26.6 % — ABNORMAL LOW (ref 36.0–46.0)
Hemoglobin: 8.5 g/dL — ABNORMAL LOW (ref 12.0–15.0)
MCH: 31 pg (ref 26.0–34.0)
MCHC: 32 g/dL (ref 30.0–36.0)
RDW: 13.6 % (ref 11.5–15.5)

## 2013-05-12 LAB — BASIC METABOLIC PANEL
BUN: 18 mg/dL (ref 6–23)
Chloride: 114 mEq/L — ABNORMAL HIGH (ref 96–112)
Creatinine, Ser: 0.99 mg/dL (ref 0.50–1.10)
Glucose, Bld: 90 mg/dL (ref 70–99)
Potassium: 3.3 mEq/L — ABNORMAL LOW (ref 3.5–5.1)

## 2013-05-12 NOTE — Discharge Summary (Signed)
Physician Discharge Summary  Kelly Mercer:[id number] DOB: [birth year] DOA: 05/10/2013  PCP: Ezequiel Kayser, MD  Admit date: 05/10/2013 Discharge date: 05/12/2013  Time spent: Greater than 30 minutes  Recommendations for Outpatient Follow-up:  I recommend that she followup with her primary care provider in no more than 2 weeks.   Discharge Diagnoses:  Active Problems:   Bright red blood per rectum   DVT (deep venous thrombosis)   Hypertension   Diabetes mellitus   Discharge Condition: Stable and improved  Filed Weights   05/10/13 0415 05/10/13 1002  Weight: 58.4 kg (128 lb 12 oz) 61.8 kg (136 lb 3.9 oz)    History of present illness:  Patient is a pleasant 79 year old woman who presented with a fall 1 month ago and had developed swelling in Left leg. On 04/30/2013 she had a doppler showing DVT. She was initially started on xarelto. But 1 week ago she started to have hematuria her medication was changed to pradaxa and was sent to Urology for further work up. She is scheduled to have CT scan sometime soon to further evaluate this. For the past few days she started to have blood on the tissue when she wipes. On 5/10 she started to have severe ractal bleeding. Every time she stood up some blood would come out from her rectum. She has filled th commode twice with blood. In ER rectal exam showing bright red blood in the rectum. Her last bloody bowel movement was about 3 hours ago. She denies feeling light headed. Denies any chest pain or shortness of breath. She has hx of some hemorrhoids in the past. She never had a colonoscopy. Denies any abdominal pain, no melena no hematemesis. We are asked to admit her for further evaluation and management.   Hospital Course:   Rectal bleeding  -Suspect diverticular/hemorrhoidal in face of anticoagulation.  -Anticoagulation has been discontinued after prolonged discussion with patient and her son regarding risk- benefit of anticoagulation. She has  already had 2 significant bleeding episodes following initiation of novel anticoagulants. -Appreciate GI consultation, no colonoscopy planned at this point.  -She has had 2 bowel movements in the past 24 hours without blood.   Acute blood loss anemia  -Secondary to rectal bleeding.  -Hemoglobin has maintained around 8.5 range. -Has not required transfusion this admission.  Recent left lower extremity DVT  -IVC filter has been placed due to risk of bleeding with anticoagulation.   Diabetes mellitus  -Well controlled.   Procedures:  IVC filter placement   Consultations:  GI, Dr. Bosie Clos  Interventional radiology, Dr. Grace Isaac  Discharge Instructions  Discharge Orders   Future Orders Complete By Expires     Diet - low sodium heart healthy  As directed     Diet Carb Modified  As directed     Discontinue IV  As directed     Increase activity slowly  As directed         Medication List    STOP taking these medications       dabigatran 75 MG Caps  Commonly known as:  PRADAXA      TAKE these medications       fenofibrate 160 MG tablet  Take 160 mg by mouth daily.     gabapentin 100 MG capsule  Commonly known as:  NEURONTIN  Take 100 mg by mouth 3 (three) times daily.     losartan 50 MG tablet  Commonly known as:  COZAAR  Take 100 mg by mouth daily.  propranolol-hydrochlorothiazide 80-25 MG per tablet  Commonly known as:  INDERIDE  Take 1 tablet by mouth daily.       No Known Allergies     Follow-up Information   Follow up with PERINI,MARK A, MD. Schedule an appointment as soon as possible for a visit in 2 weeks.   Contact information:   [address] [place] [address] [phone]        The results of significant diagnostics from this hospitalization (including imaging, microbiology, ancillary and laboratory) are listed below for reference.    Significant Diagnostic Studies: Ir Ivc Filter Plmt / S&i /img Guid/mod Sed  05/11/2013   *RADIOLOGY REPORT*  Indication: Left lower extremity DVT, now with rectal bleeding while on anticoagulation.  ULTRASOUND GUIDANCE FOR VASCULAR ACCESS IVC CATHETERIZATION AND VENOGRAM IVC FILTER INSERTION  Medications: Fentanyl 50 mcg IV; Versed 0.5 mg IV  Contrast: 30 mL Omnipaque-300  Sedation time: 15 minutes  Fluoroscopy time: 1-minute, 20-seconds  Complications: None immediate  PROCEDURE:  Informed written consent was obtained from the patient following explanation of the procedure, risks, benefits and alternatives.  A time out was performed prior to the initiation of the procedure. Maximal barrier sterile technique utilized including caps, mask, sterile gowns, sterile gloves, large sterile drape, hand hygiene, and Betadine prep.  Under sterile condition and local anesthesia, right internal jugular venous access was performed with ultrasound.  An ultrasound image was saved and sent to PACS.  Over a guide wire, the IVC filter delivery sheath and inner dilator were advanced into the IVC just above the IVC bifurcation.  Contrast injection was performed for an IVC venogram.  Through the delivery sheath, a retrievable Denali IVC filter was deployed below the level of the renal veins and above the IVC bifurcation.  Limited post deployment venacavagram was performed.  The delivery sheath was removed and hemostasis was obtained with manual compression.  A dressing was placed.  The patient tolerated the procedure well without immediate post procedural complication.  Findings:  The IVC is patent without evidence of thrombus, stenosis, or occlusion.  No variant venous anatomy.   Successful placement of the IVC filter below the level of the renal veins.  IMPRESSION:  Successful ultrasound and fluoroscopic guided placement of an infrarenal retrievable IVC filter via right jugular approach.   Original Report Authenticated By: Tacey Ruiz, MD     Microbiology: Recent Results (from the past 240 hour(s))  MRSA PCR  SCREENING     Status: None   Collection Time    05/10/13  4:14 AM      Result Value Range Status   MRSA by PCR NEGATIVE  NEGATIVE Final   Comment:            The GeneXpert MRSA Assay (FDA     approved for NASAL specimens     only), is one component of a     comprehensive MRSA colonization     surveillance program. It is not     intended to diagnose MRSA     infection nor to guide or     monitor treatment for     MRSA infections.     Labs: Basic Metabolic Panel:  Recent Labs Lab 05/10/13 0125 05/10/13 0152 05/10/13 0701 05/11/13 0455 05/12/13 0455  NA 135 139 139 139 140  K 3.5 3.5 3.3* 3.5 3.3*  CL 103 107 108 113* 114*  CO2 22  --  21 22 21   GLUCOSE 103* 99 89 83 90  BUN  53* 54* 45* 20 18  CREATININE 1.18* 1.20* 1.04 0.88 0.99  CALCIUM 9.3  --  8.9 8.4 8.0*  MG  --   --  2.1  --   --   PHOS  --   --  3.1  --   --    Liver Function Tests:  Recent Labs Lab 05/10/13 0125 05/10/13 0701  AST 16 15  ALT 8 8  ALKPHOS 30* 26*  BILITOT 0.4 0.4  PROT 6.1 5.6*  ALBUMIN 3.1* 3.0*   No results found for this basename: LIPASE, AMYLASE,  in the last 168 hours No results found for this basename: AMMONIA,  in the last 168 hours CBC:  Recent Labs Lab 05/10/13 0125 05/10/13 0152 05/10/13 0701 05/10/13 1651 05/11/13 0455 05/12/13 0455  WBC 7.1  --  6.3 5.1 4.2 6.0  HGB 9.7* 9.9* 9.3* 9.0* 7.8* 8.5*  HCT 29.3* 29.0* 28.8* 27.5* 24.8* 26.6*  MCV 93.6  --  94.1 94.2 95.0 97.1  PLT 265  --  253 238 207 198   Cardiac Enzymes: No results found for this basename: CKTOTAL, CKMB, CKMBINDEX, TROPONINI,  in the last 168 hours BNP: BNP (last 3 results) No results found for this basename: PROBNP,  in the last 8760 hours CBG:  Recent Labs Lab 05/11/13 1608 05/11/13 2012 05/12/13 0002 05/12/13 0427 05/12/13 0719  GLUCAP 106* 157* 89 95 97       Signed:  [name],[name]  [facility] Pager: [phone] 05/12/2013, 1:26 PM

## 2013-11-20 ENCOUNTER — Other Ambulatory Visit (HOSPITAL_COMMUNITY): Payer: Self-pay | Admitting: Internal Medicine

## 2013-11-20 ENCOUNTER — Encounter (INDEPENDENT_AMBULATORY_CARE_PROVIDER_SITE_OTHER): Payer: Self-pay

## 2013-11-20 ENCOUNTER — Ambulatory Visit (HOSPITAL_COMMUNITY)
Admission: RE | Admit: 2013-11-20 | Discharge: 2013-11-20 | Disposition: A | Discharge status: Home / Self Care | Payer: Medicare Other | Source: Ambulatory Visit | Attending: Vascular Surgery | Admitting: Vascular Surgery

## 2013-11-20 DIAGNOSIS — I82409 Acute embolism and thrombosis of unspecified deep veins of unspecified lower extremity: Secondary | ICD-10-CM

## 2013-11-20 DIAGNOSIS — I824Z9 Acute embolism and thrombosis of unspecified deep veins of unspecified distal lower extremity: Secondary | ICD-10-CM | POA: Insufficient documentation

## 2014-04-07 ENCOUNTER — Ambulatory Visit (INDEPENDENT_AMBULATORY_CARE_PROVIDER_SITE_OTHER): Payer: Commercial Managed Care - HMO

## 2014-04-07 VITALS — BP 151/67 | HR 69 | Resp 18

## 2014-04-07 DIAGNOSIS — L600 Ingrowing nail: Secondary | ICD-10-CM

## 2014-04-07 DIAGNOSIS — L03039 Cellulitis of unspecified toe: Secondary | ICD-10-CM

## 2014-04-07 MED ORDER — CEPHALEXIN 500 MG PO CAPS: 500.0000 mg | ORAL_CAPSULE | Freq: Three times a day (TID) | ORAL | Status: DC

## 2014-04-07 NOTE — Progress Notes (Signed)
   Subjective:    Patient ID: Kelly Mercer, female    DOB: March 12, 1926, 80 y.o.   MRN: [id number]  HPI my right big toenail has been going on for about 3 weeks and soaked it and I trimmed on it and it is very tender and is red and on my left foot I hit my foot with my cane going across the grass and I have factor 5     Review of Systems  HENT: Positive for hearing loss and sinus pressure.        Sneezing  Endocrine: Positive for cold intolerance.  Genitourinary: Positive for frequency.  Allergic/Immunologic: Positive for environmental allergies.  All other systems reviewed and are negative.      Objective:   Physical Exam 80 year old white female options this time with her daughter your caregiver with a complaint of ingrowing right great toenail lateral border and red painful tender inflamed is mild erythema and drainage tenderness for several weeks now. Patient does have factor V coagulability status is currently on Coumadin cover last INR was 1.25 patient does not have excessive bleeding problems at this time to objective findings as follows vascular status is intact DP postal for PT one over 4 bilateral capillary refill timed 3-4 seconds all digits skin temperature is warm mild varicosities noted bilateral there is no edema rubor pallor noted neurologically epicritic and proprioceptive sensations intact and symmetric bilateral is normal plantar response and DTRs. Dermatologically skin color pigment normal hair growth absent nails somewhat criptotic 2 lateral border right hallux is Synetta Shadow criptotic with some granulation tissue and erythema the lateral nail fold. Orthopedic biomechanical exam unremarkable mild flexible digital contractures are noted dermatologically no other open wounds ulcerations or lesions are noted.       Assessment & Plan:  Assessment this time is ingrowing nail lateral border right great toe with paronychia. Plan at this time is partial nail excision and phenol  matricectomy lateral border. Local anesthetic blocks Mr. to the right great toe additional 2 cc of lidocaine were utilized for complete anesthesia total of 5 cc 50-50 mixture of 2% Xylocaine plain and 0.5% Marcaine plain. Betadine prep performed at this time the lateral border was excised feel matricectomy followed by alcohol wash Silvadene cream and gauze dressing being applied to the right great toe. Patient how the procedure well to minimal or no discomfort. Patient given instructions for daily soaks and Betadine warm water. Recommended Tylenol as needed for pain. Prescription for cephalexin for to pharmacy cephalexin 500 mg 3 times a day x10 days. Revised maybe raised the INR slightly as it does potentiate the effects of Coumadin. Although her INRs currently too low. Patient will maintain dressing changes with daily dressing starting tomorrow Neosporin recheck in 3-3 weeks for followup for nail check  Harriet Masson DPM

## 2014-04-07 NOTE — Patient Instructions (Signed)

## 2014-04-23 ENCOUNTER — Ambulatory Visit: Payer: Commercial Managed Care - HMO

## 2014-04-23 ENCOUNTER — Ambulatory Visit (INDEPENDENT_AMBULATORY_CARE_PROVIDER_SITE_OTHER): Payer: Self-pay

## 2014-04-23 VITALS — BP 158/75 | HR 65 | Resp 18

## 2014-04-23 DIAGNOSIS — Z09 Encounter for follow-up examination after completed treatment for conditions other than malignant neoplasm: Secondary | ICD-10-CM

## 2014-04-23 DIAGNOSIS — L600 Ingrowing nail: Secondary | ICD-10-CM

## 2014-04-23 NOTE — Patient Instructions (Signed)

## 2014-04-23 NOTE — Progress Notes (Signed)
   Subjective:    Patient ID: Kelly Mercer, female    DOB: January 20, 1926, 80 y.o.   MRN: [id number]  HPI I am doing better on my right big toenail but it is still a little red and it is sore at times and some draining still    Review of Systems no systemic changes or findings noted at this time to     Objective:   Physical Exam Neurovascular status is intact pedal pulses palpable patient is status post AP nail procedure right great toe lateral border there is some slight edema and erythema had a little with red swollen last week however family members repair with paronychia this was from Neosporin and Polysporin intact she improved which may indicate shooting a Verdis Frederickson allergic reaction or sensitivity to Neosporin E-Mycin. Again pedal pulses are palpable epicritic and proprioceptive sensations otherwise unchanged slight edema erythema no purulent discharge or drainage no malodor minimal discomfort continue with antibiotic ointment or Polysporin and Band-Aid during the day where dry at night recheck within the next one month if fails to improve or resolve completely      Assessment & Plan:  Assessment status post AP nail procedure lateral border right great toe continue with daily cleansing with soap and water apply Polysporin and Band-Aid dressing daily under in daily or drainage night discontinued discharge to an as-needed basis for future followup next  Harriet Masson DPM

## 2014-08-19 IMAGING — XA IR IVC FILTER PLMT / S&I /IMG GUID/MOD SED
1 series · 14 of 14 positions shown · IV contrast (IODINE)
Comparison: none

INDICATION: Left lower extremity DVT, now with rectal bleeding
while on anticoagulation.

[Series 300: ld dsa body · 14 of 14 slices shown]
[im 1/14]
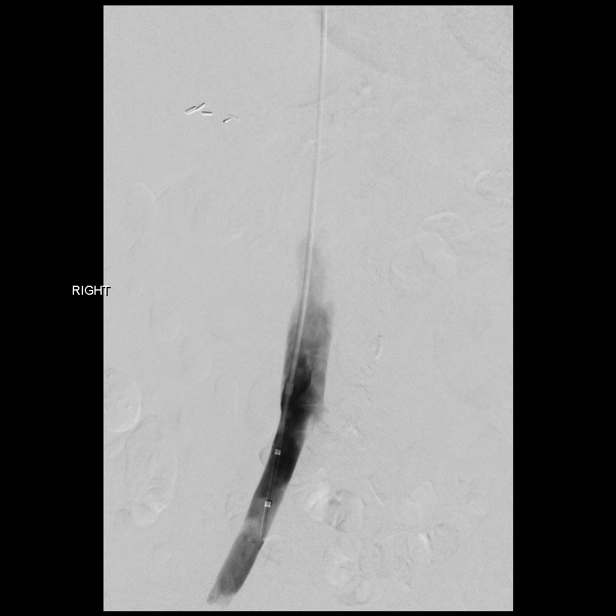
[im 2/14]
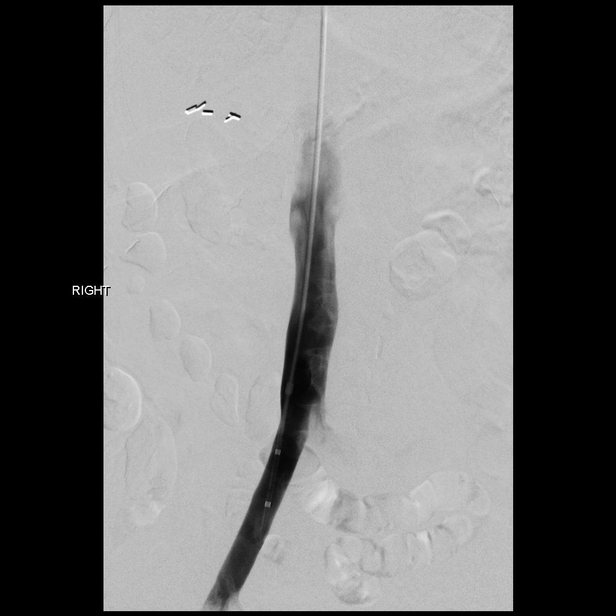
[im 3/14]
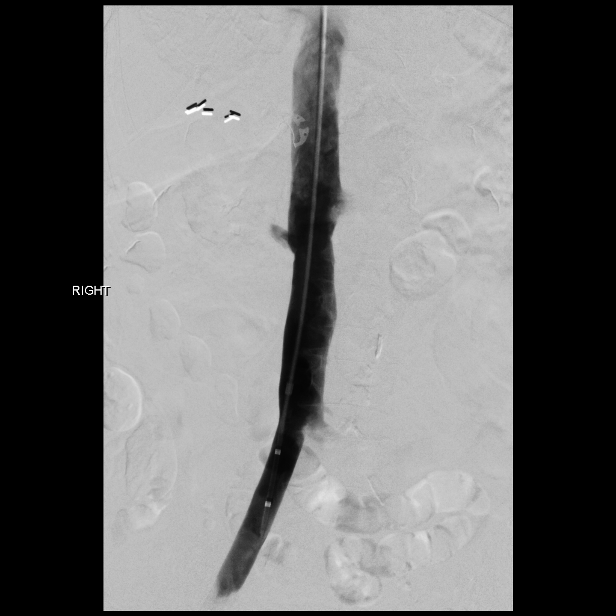
[im 4/14]
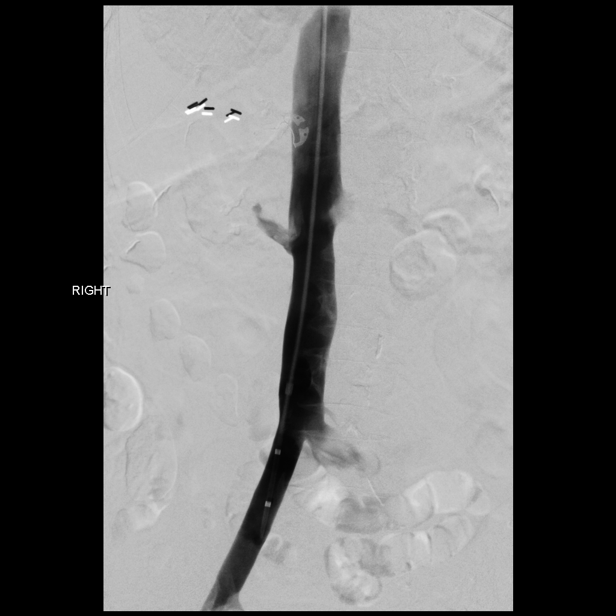
[im 5/14]
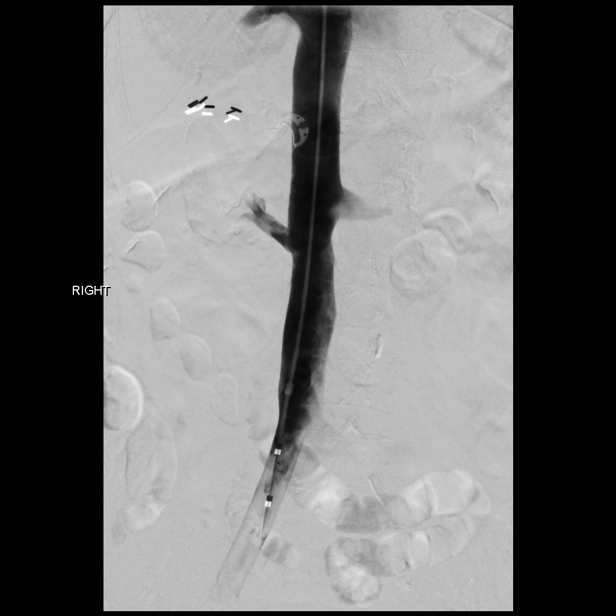
[im 6/14]
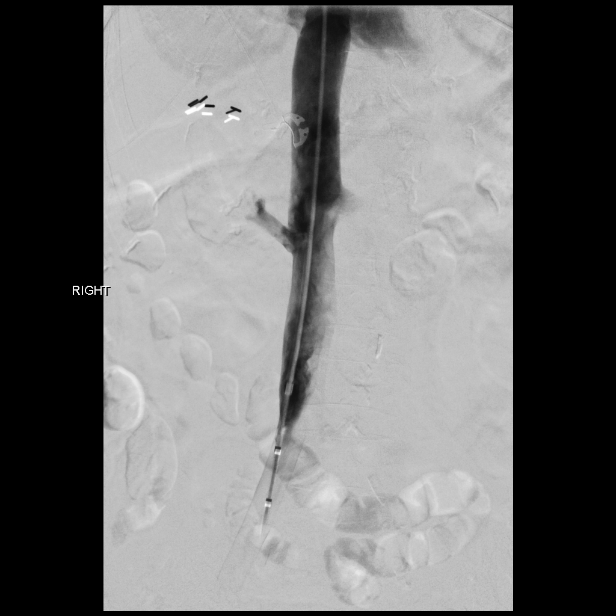
[im 7/14]
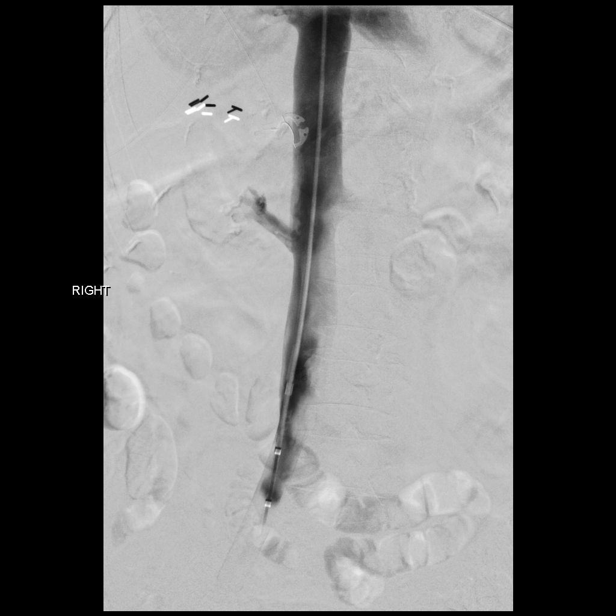
[im 8/14]
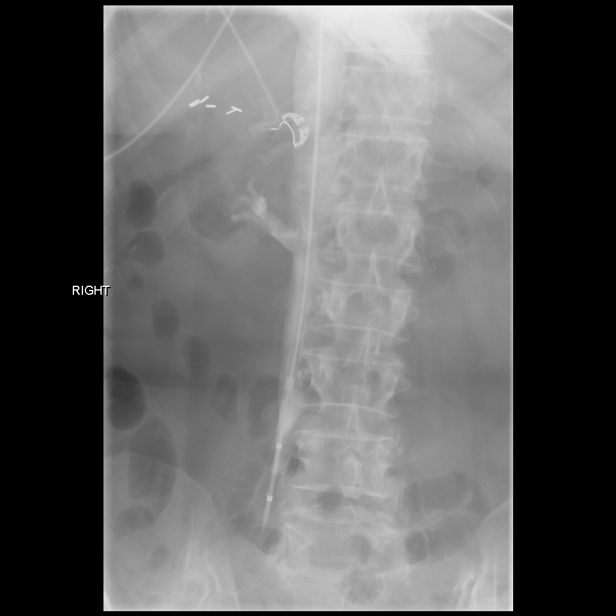
[im 9/14]
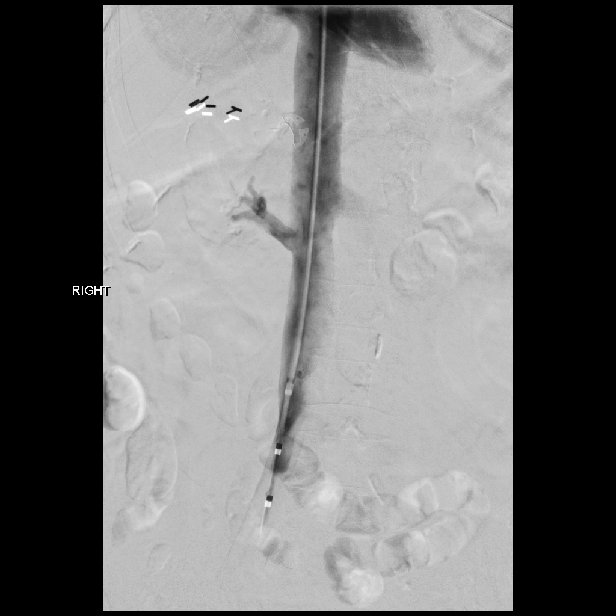
[im 10/14]
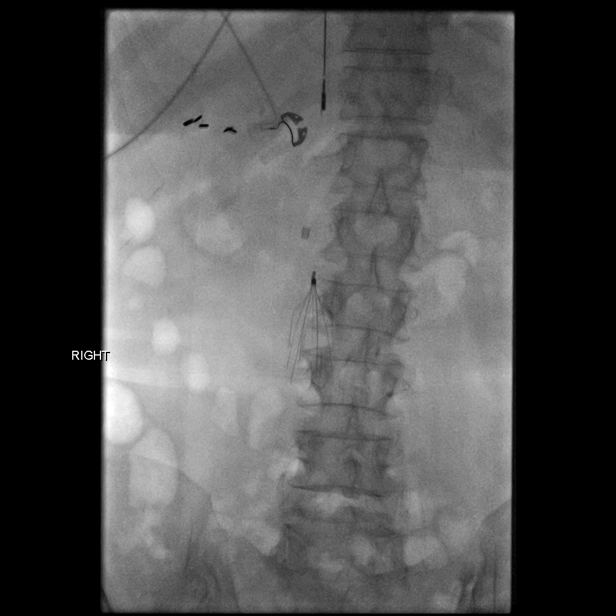
[im 11/14]
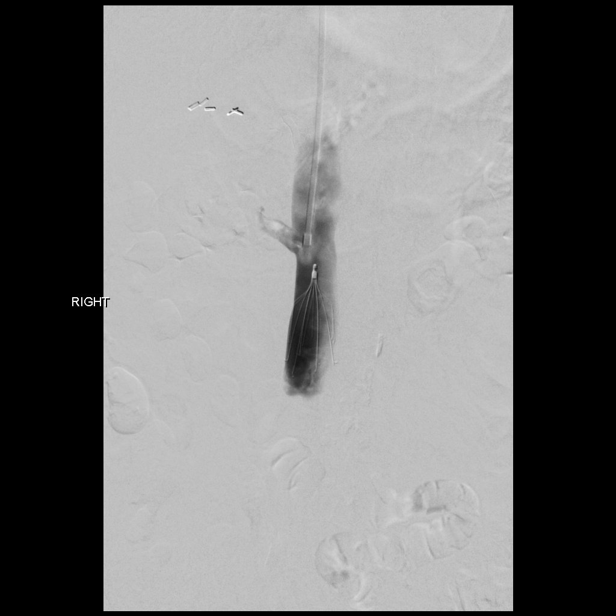
[im 12/14]
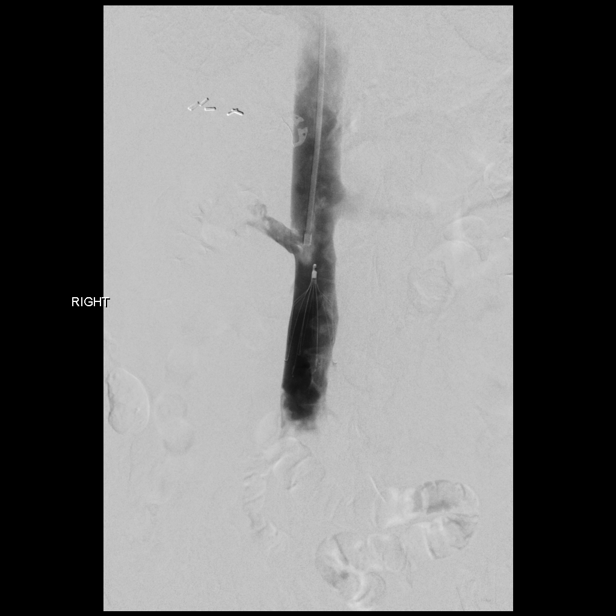
[im 13/14]
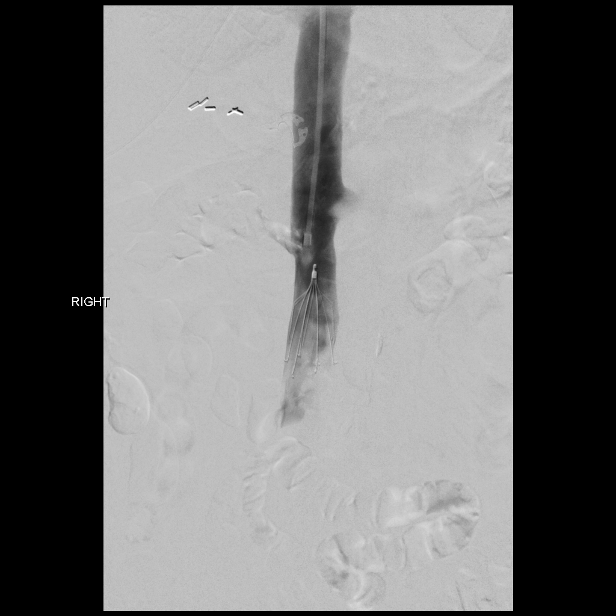
[im 14/14]
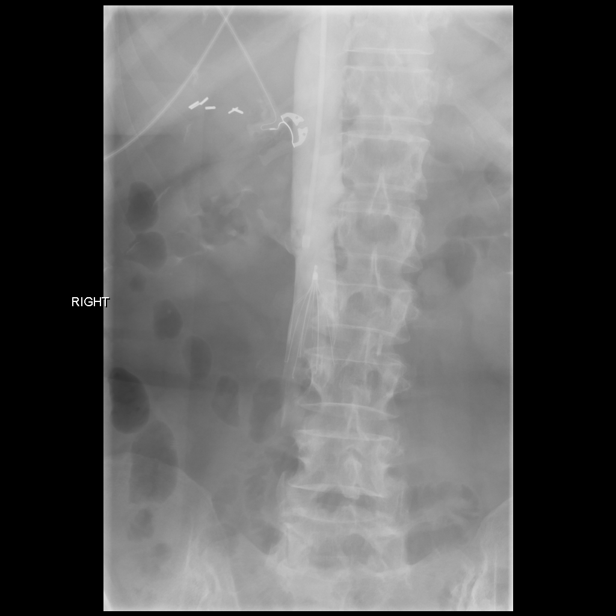

[14 of 14 positions shown; findings below may reference images not displayed]

ULTRASOUND GUIDANCE FOR VASCULAR ACCESS
IVC CATHETERIZATION AND VENOGRAM
IVC FILTER INSERTION

Medications: Fentanyl 50 mcg IV; Versed 0.5 mg IV

Contrast: 30 mL Tmnipaque-ISS

Sedation time: 15 minutes

Fluoroscopy time: 1-minute, 20-seconds

Complications: None immediate

PROCEDURE:

Informed written consent was obtained from the patient following
explanation of the procedure, risks, benefits and alternatives.  A
time out was performed prior to the initiation of the procedure.
Maximal barrier sterile technique utilized including caps, mask,
sterile gowns, sterile gloves, large sterile drape, hand hygiene,
and Betadine prep.

Under sterile condition and local anesthesia, right internal
jugular venous access was performed with ultrasound.  An ultrasound
image was saved and sent to PACS.  Over a guide wire, the IVC
filter delivery sheath and inner dilator were advanced into the IVC
just above the IVC bifurcation.  Contrast injection was performed
for an IVC venogram.

Through the delivery sheath, a retrievable Denali IVC filter was
deployed below the level of the renal veins and above the IVC
bifurcation.  Limited post deployment venacavagram was performed.

The delivery sheath was removed and hemostasis was obtained with
manual compression.  A dressing was placed.  The patient tolerated
the procedure well without immediate post procedural complication.
FINDINGS: The IVC is patent without evidence of thrombus, stenosis, or
occlusion.  No variant venous anatomy.   Successful placement of
the IVC filter below the level of the renal veins.
IMPRESSION: Successful ultrasound and fluoroscopic guided placement of an
infrarenal retrievable IVC filter via right jugular approach.

## 2014-11-23 ENCOUNTER — Ambulatory Visit (HOSPITAL_COMMUNITY)
Admission: RE | Admit: 2014-11-23 | Discharge: 2014-11-23 | Disposition: A | Discharge status: Home / Self Care | Payer: Medicare HMO | Source: Ambulatory Visit | Attending: Internal Medicine | Admitting: Internal Medicine

## 2014-11-23 ENCOUNTER — Encounter (HOSPITAL_COMMUNITY): Payer: Self-pay

## 2014-11-23 ENCOUNTER — Other Ambulatory Visit (HOSPITAL_COMMUNITY): Payer: Self-pay | Admitting: Internal Medicine

## 2014-11-23 DIAGNOSIS — M810 Age-related osteoporosis without current pathological fracture: Secondary | ICD-10-CM | POA: Diagnosis not present

## 2014-11-23 MED ORDER — SODIUM CHLORIDE 0.9 % IV SOLN
Freq: Once | INTRAVENOUS | Status: AC
  Administered 2014-11-23: 250 mL via INTRAVENOUS

## 2014-11-23 MED ORDER — ZOLEDRONIC ACID 5 MG/100ML IV SOLN
5.0000 mg | Freq: Once | INTRAVENOUS | Status: AC
  Administered 2014-11-23: 5 mg via INTRAVENOUS
  Filled 2014-11-23: qty 100

## 2014-11-23 NOTE — Discharge Instructions (Signed)
RECLAST °Zoledronic Acid injection (Paget's Disease, Osteoporosis) °What is this medicine? °ZOLEDRONIC ACID (ZOE le dron ik AS id) lowers the amount of calcium loss from bone. It is used to treat Paget's disease and osteoporosis in women. °This medicine may be used for other purposes; ask your health care provider or pharmacist if you have questions. °COMMON BRAND NAME(S): Reclast, Zometa °What should I tell my health care provider before I take this medicine? °They need to know if you have any of these conditions: °-aspirin-sensitive asthma °-cancer, especially if you are receiving medicines used to treat cancer °-dental disease or wear dentures °-infection °-kidney disease °-low levels of calcium in the blood °-past surgery on the parathyroid gland or intestines °-receiving corticosteroids like dexamethasone or prednisone °-an unusual or allergic reaction to zoledronic acid, other medicines, foods, dyes, or preservatives °-pregnant or trying to get pregnant °-breast-feeding °How should I use this medicine? °This medicine is for infusion into a vein. It is given by a health care professional in a hospital or clinic setting. °Talk to your pediatrician regarding the use of this medicine in children. This medicine is not approved for use in children. °Overdosage: If you think you have taken too much of this medicine contact a poison control center or emergency room at once. °NOTE: This medicine is only for you. Do not share this medicine with others. °What if I miss a dose? °It is important not to miss your dose. Call your doctor or health care professional if you are unable to keep an appointment. °What may interact with this medicine? °-certain antibiotics given by injection °-NSAIDs, medicines for pain and inflammation, like ibuprofen or naproxen °-some diuretics like bumetanide, furosemide °-teriparatide °This list may not describe all possible interactions. Give your health care provider a list of all the  medicines, herbs, non-prescription drugs, or dietary supplements you use. Also tell them if you smoke, drink alcohol, or use illegal drugs. Some items may interact with your medicine. °What should I watch for while using this medicine? °Visit your doctor or health care professional for regular checkups. It may be some time before you see the benefit from this medicine. Do not stop taking your medicine unless your doctor tells you to. Your doctor may order blood tests or other tests to see how you are doing. °Women should inform their doctor if they wish to become pregnant or think they might be pregnant. There is a potential for serious side effects to an unborn child. Talk to your health care professional or pharmacist for more information. °You should make sure that you get enough calcium and vitamin D while you are taking this medicine. Discuss the foods you eat and the vitamins you take with your health care professional. °Some people who take this medicine have severe bone, joint, and/or muscle pain. This medicine may also increase your risk for jaw problems or a broken thigh bone. Tell your doctor right away if you have severe pain in your jaw, bones, joints, or muscles. Tell your doctor if you have any pain that does not go away or that gets worse. °Tell your dentist and dental surgeon that you are taking this medicine. You should not have major dental surgery while on this medicine. See your dentist to have a dental exam and fix any dental problems before starting this medicine. Take good care of your teeth while on this medicine. Make sure you see your dentist for regular follow-up appointments. °What side effects may I notice from receiving this   medicine? °Side effects that you should report to your doctor or health care professional as soon as possible: °-allergic reactions like skin rash, itching or hives, swelling of the face, lips, or tongue °-anxiety, confusion, or depression °-breathing  problems °-changes in vision °-eye pain °-feeling faint or lightheaded, falls °-jaw pain, especially after dental work °-mouth sores °-muscle cramps, stiffness, or weakness °-trouble passing urine or change in the amount of urine °Side effects that usually do not require medical attention (report to your doctor or health care professional if they continue or are bothersome): °-bone, joint, or muscle pain °-constipation °-diarrhea °-fever °-hair loss °-irritation at site where injected °-loss of appetite °-nausea, vomiting °-stomach upset °-trouble sleeping °-trouble swallowing °-weak or tired °This list may not describe all possible side effects. Call your doctor for medical advice about side effects. You may report side effects to FDA at 1-800-FDA-1088. °Where should I keep my medicine? °This drug is given in a hospital or clinic and will not be stored at home. °NOTE: This sheet is a summary. It may not cover all possible information. If you have questions about this medicine, talk to your doctor, pharmacist, or health care provider. °© 2015, [employer]. (2013-06-01 10:03:48) °Osteoporosis °Throughout your life, your body breaks down old bone and replaces it with new bone. As you get older, your body does not replace bone as quickly as it breaks it down. By the age of 30 years, most people begin to gradually lose bone because of the imbalance between bone loss and replacement. Some people lose more bone than others. Bone loss beyond a specified normal degree is considered osteoporosis.  °Osteoporosis affects the strength and durability of your bones. The inside of the ends of your bones and your flat bones, like the bones of your pelvis, look like honeycomb, filled with tiny open spaces. As bone loss occurs, your bones become less dense. This means that the open spaces inside your bones become bigger and the walls between these spaces become thinner. This makes your bones weaker. Bones of a person with  osteoporosis can become so weak that they can break (fracture) during minor accidents, such as a simple fall. °CAUSES  °The following factors have been associated with the development of osteoporosis: °· Smoking. °· Drinking more than 2 alcoholic drinks several days per week. °· Long-term use of certain medicines: °¨ Corticosteroids. °¨ Chemotherapy medicines. °¨ Thyroid medicines. °¨ Antiepileptic medicines. °¨ Gonadal hormone suppression medicine. °¨ Immunosuppression medicine. °· Being underweight. °· Lack of physical activity. °· Lack of exposure to the sun. This can lead to vitamin D deficiency. °· Certain medical conditions: °¨ Certain inflammatory bowel diseases, such as Crohn disease and ulcerative colitis. °¨ Diabetes. °¨ Hyperthyroidism. °¨ Hyperparathyroidism. °RISK FACTORS °Anyone can develop osteoporosis. However, the following factors can increase your risk of developing osteoporosis: °· Gender--Women are at higher risk than men. °· Age--Being older than 50 years increases your risk. °· Ethnicity--White and Asian people have an increased risk. °· Weight --Being extremely underweight can increase your risk of osteoporosis. °· Family history of osteoporosis--Having a family member who has developed osteoporosis can increase your risk. °SYMPTOMS  °Usually, people with osteoporosis have no symptoms.  °DIAGNOSIS  °Signs during a physical exam that may prompt your caregiver to suspect osteoporosis include: °· Decreased height. This is usually caused by the compression of the bones that form your spine (vertebrae) because they have weakened and become fractured. °· A curving or rounding of the upper back (kyphosis). °  To confirm signs of osteoporosis, your caregiver may request a procedure that uses 2 low-dose X-ray beams with different levels of energy to measure your bone mineral density (dual-energy X-ray absorptiometry [DXA]). Also, your caregiver may check your level of vitamin D. °TREATMENT  °The goal of  osteoporosis treatment is to strengthen bones in order to decrease the risk of bone fractures. There are different types of medicines available to help achieve this goal. Some of these medicines work by slowing the processes of bone loss. Some medicines work by increasing bone density. Treatment also involves making sure that your levels of calcium and vitamin D are adequate. °PREVENTION  °There are things you can do to help prevent osteoporosis. Adequate intake of calcium and vitamin D can help you achieve optimal bone mineral density. Regular exercise can also help, especially resistance and weight-bearing activities. If you smoke, quitting smoking is an important part of osteoporosis prevention. °MAKE SURE YOU: °· Understand these instructions. °· Will watch your condition. °· Will get help right away if you are not doing well or get worse. °FOR MORE INFORMATION °www.osteo.org and www.nof.org °Document Released: 09/26/2005 Document Revised: 04/13/2013 Document Reviewed: 12/01/2011 °ExitCare® Patient Information ©2015 [employer]. This information is not intended to replace advice given to you by your health care provider. Make sure you discuss any questions you have with your health care provider. ° ° °

## 2014-11-23 NOTE — Progress Notes (Signed)
Pt states that she only takes dietary calcium. Lab results are within criteria for infusion. Encouraged patient to discuss with Dr Joylene Draft whether she needs to take any additional supplemental calcium and vitamin D and any other questions or concerns. Pt verbalized understanding

## 2014-12-23 ENCOUNTER — Emergency Department (HOSPITAL_COMMUNITY): Payer: Medicare HMO | Admitting: Anesthesiology

## 2014-12-23 ENCOUNTER — Inpatient Hospital Stay (HOSPITAL_COMMUNITY)
Admission: EM | Admit: 2014-12-23 | Discharge: 2014-12-27 | DRG: 481 | Disposition: A | Discharge status: Skilled Nursing Facility | Payer: Medicare HMO | Attending: Internal Medicine | Admitting: Internal Medicine

## 2014-12-23 ENCOUNTER — Inpatient Hospital Stay (HOSPITAL_COMMUNITY): Payer: Medicare HMO

## 2014-12-23 ENCOUNTER — Encounter (HOSPITAL_COMMUNITY): Payer: Self-pay | Admitting: Emergency Medicine

## 2014-12-23 ENCOUNTER — Encounter (HOSPITAL_COMMUNITY)
Admission: EM | Disposition: A | Discharge status: Skilled Nursing Facility | Payer: Self-pay | Source: Home / Self Care | Attending: Internal Medicine

## 2014-12-23 ENCOUNTER — Emergency Department (HOSPITAL_COMMUNITY): Payer: Medicare HMO

## 2014-12-23 DIAGNOSIS — D509 Iron deficiency anemia, unspecified: Secondary | ICD-10-CM | POA: Diagnosis present

## 2014-12-23 DIAGNOSIS — S72141A Displaced intertrochanteric fracture of right femur, initial encounter for closed fracture: Principal | ICD-10-CM | POA: Diagnosis present

## 2014-12-23 DIAGNOSIS — K219 Gastro-esophageal reflux disease without esophagitis: Secondary | ICD-10-CM | POA: Diagnosis present

## 2014-12-23 DIAGNOSIS — D62 Acute posthemorrhagic anemia: Secondary | ICD-10-CM | POA: Diagnosis not present

## 2014-12-23 DIAGNOSIS — M179 Osteoarthritis of knee, unspecified: Secondary | ICD-10-CM | POA: Diagnosis present

## 2014-12-23 DIAGNOSIS — Z7901 Long term (current) use of anticoagulants: Secondary | ICD-10-CM | POA: Diagnosis not present

## 2014-12-23 DIAGNOSIS — W010XXA Fall on same level from slipping, tripping and stumbling without subsequent striking against object, initial encounter: Secondary | ICD-10-CM | POA: Diagnosis present

## 2014-12-23 DIAGNOSIS — M8580 Other specified disorders of bone density and structure, unspecified site: Secondary | ICD-10-CM | POA: Diagnosis present

## 2014-12-23 DIAGNOSIS — Y92009 Unspecified place in unspecified non-institutional (private) residence as the place of occurrence of the external cause: Secondary | ICD-10-CM

## 2014-12-23 DIAGNOSIS — I10 Essential (primary) hypertension: Secondary | ICD-10-CM | POA: Diagnosis present

## 2014-12-23 DIAGNOSIS — Z79899 Other long term (current) drug therapy: Secondary | ICD-10-CM

## 2014-12-23 DIAGNOSIS — D72829 Elevated white blood cell count, unspecified: Secondary | ICD-10-CM | POA: Diagnosis present

## 2014-12-23 DIAGNOSIS — N179 Acute kidney failure, unspecified: Secondary | ICD-10-CM | POA: Diagnosis not present

## 2014-12-23 DIAGNOSIS — H9190 Unspecified hearing loss, unspecified ear: Secondary | ICD-10-CM | POA: Diagnosis present

## 2014-12-23 DIAGNOSIS — E876 Hypokalemia: Secondary | ICD-10-CM | POA: Diagnosis not present

## 2014-12-23 DIAGNOSIS — Z01811 Encounter for preprocedural respiratory examination: Secondary | ICD-10-CM

## 2014-12-23 DIAGNOSIS — S72001A Fracture of unspecified part of neck of right femur, initial encounter for closed fracture: Secondary | ICD-10-CM

## 2014-12-23 DIAGNOSIS — W19XXXA Unspecified fall, initial encounter: Secondary | ICD-10-CM

## 2014-12-23 DIAGNOSIS — E119 Type 2 diabetes mellitus without complications: Secondary | ICD-10-CM | POA: Diagnosis present

## 2014-12-23 DIAGNOSIS — Z86718 Personal history of other venous thrombosis and embolism: Secondary | ICD-10-CM

## 2014-12-23 DIAGNOSIS — S72009A Fracture of unspecified part of neck of unspecified femur, initial encounter for closed fracture: Secondary | ICD-10-CM | POA: Diagnosis present

## 2014-12-23 DIAGNOSIS — M25551 Pain in right hip: Secondary | ICD-10-CM | POA: Diagnosis present

## 2014-12-23 HISTORY — PX: FEMUR IM NAIL: SHX1597

## 2014-12-23 LAB — URINALYSIS, ROUTINE W REFLEX MICROSCOPIC
Bilirubin Urine: NEGATIVE
GLUCOSE, UA: NEGATIVE mg/dL
Hgb urine dipstick: NEGATIVE
Ketones, ur: NEGATIVE mg/dL
LEUKOCYTES UA: NEGATIVE
Nitrite: NEGATIVE
Protein, ur: NEGATIVE mg/dL
SPECIFIC GRAVITY, URINE: 1.016 (ref 1.005–1.030)
UROBILINOGEN UA: 0.2 mg/dL (ref 0.0–1.0)
pH: 6.5 (ref 5.0–8.0)

## 2014-12-23 LAB — CBC WITH DIFFERENTIAL/PLATELET
BASOS PCT: 0 % (ref 0–1)
Basophils Absolute: 0 10*3/uL (ref 0.0–0.1)
EOS PCT: 0 % (ref 0–5)
Eosinophils Absolute: 0 10*3/uL (ref 0.0–0.7)
HEMATOCRIT: 34.5 % — AB (ref 36.0–46.0)
HEMOGLOBIN: 10.9 g/dL — AB (ref 12.0–15.0)
Lymphocytes Relative: 11 % — ABNORMAL LOW (ref 12–46)
Lymphs Abs: 1.3 10*3/uL (ref 0.7–4.0)
MCH: 31.1 pg (ref 26.0–34.0)
MCHC: 31.6 g/dL (ref 30.0–36.0)
MCV: 98.6 fL (ref 78.0–100.0)
MONOS PCT: 4 % (ref 3–12)
Monocytes Absolute: 0.5 10*3/uL (ref 0.1–1.0)
NEUTROS ABS: 10.1 10*3/uL — AB (ref 1.7–7.7)
Neutrophils Relative %: 85 % — ABNORMAL HIGH (ref 43–77)
Platelets: 245 10*3/uL (ref 150–400)
RBC: 3.5 MIL/uL — ABNORMAL LOW (ref 3.87–5.11)
RDW: 13.1 % (ref 11.5–15.5)
WBC: 11.9 10*3/uL — ABNORMAL HIGH (ref 4.0–10.5)

## 2014-12-23 LAB — COMPREHENSIVE METABOLIC PANEL
ALT: 13 U/L (ref 0–35)
ANION GAP: 6 (ref 5–15)
AST: 28 U/L (ref 0–37)
Albumin: 3.9 g/dL (ref 3.5–5.2)
Alkaline Phosphatase: 43 U/L (ref 39–117)
BILIRUBIN TOTAL: 0.7 mg/dL (ref 0.3–1.2)
BUN: 36 mg/dL — AB (ref 6–23)
CALCIUM: 9.4 mg/dL (ref 8.4–10.5)
CHLORIDE: 107 meq/L (ref 96–112)
CO2: 26 mmol/L (ref 19–32)
Creatinine, Ser: 1.03 mg/dL (ref 0.50–1.10)
GFR calc Af Amer: 55 mL/min — ABNORMAL LOW (ref >90)
GFR, EST NON AFRICAN AMERICAN: 48 mL/min — AB (ref >90)
Glucose, Bld: 122 mg/dL — ABNORMAL HIGH (ref 70–99)
Potassium: 3.9 mmol/L (ref 3.5–5.1)
Sodium: 139 mmol/L (ref 135–145)
Total Protein: 6.7 g/dL (ref 6.0–8.3)

## 2014-12-23 LAB — PROTIME-INR
INR: 1.91 — AB (ref 0.00–1.49)
PROTHROMBIN TIME: 22.1 s — AB (ref 11.6–15.2)

## 2014-12-23 SURGERY — INSERTION, INTRAMEDULLARY ROD, FEMUR
Anesthesia: General | Site: Hip | Laterality: Right

## 2014-12-23 MED ORDER — LIDOCAINE HCL (CARDIAC) 20 MG/ML IV SOLN
INTRAVENOUS | Status: AC
  Filled 2014-12-23: qty 5

## 2014-12-23 MED ORDER — METOCLOPRAMIDE HCL 10 MG PO TABS: 5.0000 mg | ORAL_TABLET | Freq: Three times a day (TID) | ORAL | Status: DC | PRN

## 2014-12-23 MED ORDER — ROCURONIUM BROMIDE 100 MG/10ML IV SOLN
INTRAVENOUS | Status: DC | PRN
  Administered 2014-12-23: 20 mg via INTRAVENOUS

## 2014-12-23 MED ORDER — FENTANYL CITRATE 0.05 MG/ML IJ SOLN
INTRAMUSCULAR | Status: DC | PRN
  Administered 2014-12-23 (×5): 50 ug via INTRAVENOUS

## 2014-12-23 MED ORDER — ONDANSETRON HCL 4 MG/2ML IJ SOLN: 4.0000 mg | Freq: Four times a day (QID) | INTRAMUSCULAR | Status: DC | PRN

## 2014-12-23 MED ORDER — GLYCOPYRROLATE 0.2 MG/ML IJ SOLN
INTRAMUSCULAR | Status: DC | PRN
  Administered 2014-12-23: 0.6 mg via INTRAVENOUS

## 2014-12-23 MED ORDER — ACETAMINOPHEN 650 MG RE SUPP
650.0000 mg | Freq: Four times a day (QID) | RECTAL | Status: DC | PRN
Start: 2014-12-23 — End: 2014-12-27

## 2014-12-23 MED ORDER — GLYCOPYRROLATE 0.2 MG/ML IJ SOLN
INTRAMUSCULAR | Status: AC
  Filled 2014-12-23: qty 3

## 2014-12-23 MED ORDER — MORPHINE SULFATE 4 MG/ML IJ SOLN
4.0000 mg | Freq: Once | INTRAMUSCULAR | Status: AC
  Administered 2014-12-23: 4 mg via INTRAVENOUS
  Filled 2014-12-23: qty 1

## 2014-12-23 MED ORDER — FERROUS SULFATE 325 (65 FE) MG PO TABS
325.0000 mg | ORAL_TABLET | Freq: Three times a day (TID) | ORAL | Status: DC
  Administered 2014-12-25 – 2014-12-27 (×8): 325 mg via ORAL
  Filled 2014-12-23 (×13): qty 1

## 2014-12-23 MED ORDER — SODIUM CHLORIDE 0.9 % IV SOLN
INTRAVENOUS | Status: DC
  Administered 2014-12-23: 22:00:00 via INTRAVENOUS

## 2014-12-23 MED ORDER — MORPHINE SULFATE 2 MG/ML IJ SOLN: 0.5000 mg | INTRAMUSCULAR | Status: DC | PRN

## 2014-12-23 MED ORDER — EPHEDRINE SULFATE 50 MG/ML IJ SOLN
INTRAMUSCULAR | Status: DC | PRN
  Administered 2014-12-23 (×2): 10 mg via INTRAVENOUS

## 2014-12-23 MED ORDER — FENTANYL CITRATE 0.05 MG/ML IJ SOLN
INTRAMUSCULAR | Status: AC
  Filled 2014-12-23: qty 5

## 2014-12-23 MED ORDER — SUCCINYLCHOLINE CHLORIDE 20 MG/ML IJ SOLN
INTRAMUSCULAR | Status: DC | PRN
  Administered 2014-12-23: 100 mg via INTRAVENOUS

## 2014-12-23 MED ORDER — CEFAZOLIN SODIUM-DEXTROSE 2-3 GM-% IV SOLR
INTRAVENOUS | Status: DC | PRN
  Administered 2014-12-23: 2 g via INTRAVENOUS

## 2014-12-23 MED ORDER — ACETAMINOPHEN 325 MG PO TABS
650.0000 mg | ORAL_TABLET | Freq: Four times a day (QID) | ORAL | Status: DC | PRN
Start: 2014-12-23 — End: 2014-12-27
  Administered 2014-12-26 – 2014-12-27 (×2): 650 mg via ORAL
  Filled 2014-12-23 (×2): qty 2

## 2014-12-23 MED ORDER — METOCLOPRAMIDE HCL 5 MG/ML IJ SOLN: 5.0000 mg | Freq: Three times a day (TID) | INTRAMUSCULAR | Status: DC | PRN

## 2014-12-23 MED ORDER — CEFAZOLIN SODIUM-DEXTROSE 2-3 GM-% IV SOLR
2.0000 g | Freq: Four times a day (QID) | INTRAVENOUS | Status: AC
  Administered 2014-12-24 (×2): 2 g via INTRAVENOUS
  Filled 2014-12-23 (×2): qty 50

## 2014-12-23 MED ORDER — LACTATED RINGERS IV SOLN
INTRAVENOUS | Status: DC | PRN
  Administered 2014-12-23: 19:00:00 via INTRAVENOUS

## 2014-12-23 MED ORDER — MEPERIDINE HCL 50 MG/ML IJ SOLN: 6.2500 mg | INTRAMUSCULAR | Status: DC | PRN

## 2014-12-23 MED ORDER — ROCURONIUM BROMIDE 100 MG/10ML IV SOLN
INTRAVENOUS | Status: AC
  Filled 2014-12-23: qty 2

## 2014-12-23 MED ORDER — METHOCARBAMOL 500 MG PO TABS
500.0000 mg | ORAL_TABLET | Freq: Four times a day (QID) | ORAL | Status: DC | PRN
Start: 2014-12-23 — End: 2014-12-27
  Administered 2014-12-23 – 2014-12-27 (×7): 500 mg via ORAL
  Filled 2014-12-23 (×7): qty 1

## 2014-12-23 MED ORDER — 0.9 % SODIUM CHLORIDE (POUR BTL) OPTIME
TOPICAL | Status: DC | PRN
  Administered 2014-12-23: 1000 mL

## 2014-12-23 MED ORDER — PHENOL 1.4 % MT LIQD: 1.0000 | OROMUCOSAL | Status: DC | PRN

## 2014-12-23 MED ORDER — MENTHOL 3 MG MT LOZG: 1.0000 | LOZENGE | OROMUCOSAL | Status: DC | PRN

## 2014-12-23 MED ORDER — NEOSTIGMINE METHYLSULFATE 10 MG/10ML IV SOLN
INTRAVENOUS | Status: AC
  Filled 2014-12-23: qty 1

## 2014-12-23 MED ORDER — HYDROCODONE-ACETAMINOPHEN 5-325 MG PO TABS
1.0000 | ORAL_TABLET | Freq: Four times a day (QID) | ORAL | Status: DC | PRN
  Administered 2014-12-23 – 2014-12-24 (×3): 1 via ORAL
  Administered 2014-12-24: 2 via ORAL
  Administered 2014-12-25: 1 via ORAL
  Filled 2014-12-23: qty 1
  Filled 2014-12-23: qty 2

## 2014-12-23 MED ORDER — MIDAZOLAM HCL 2 MG/2ML IJ SOLN
INTRAMUSCULAR | Status: AC
  Filled 2014-12-23: qty 2

## 2014-12-23 MED ORDER — LIDOCAINE HCL (CARDIAC) 20 MG/ML IV SOLN
INTRAVENOUS | Status: DC | PRN
  Administered 2014-12-23: 50 mg via INTRAVENOUS

## 2014-12-23 MED ORDER — PROPOFOL 10 MG/ML IV BOLUS
INTRAVENOUS | Status: AC
  Filled 2014-12-23: qty 20

## 2014-12-23 MED ORDER — DEXTROSE 5 % IV SOLN
500.0000 mg | Freq: Four times a day (QID) | INTRAVENOUS | Status: DC | PRN
  Filled 2014-12-23: qty 5

## 2014-12-23 MED ORDER — ONDANSETRON HCL 4 MG/2ML IJ SOLN
INTRAMUSCULAR | Status: DC | PRN
  Administered 2014-12-23: 4 mg via INTRAVENOUS

## 2014-12-23 MED ORDER — HYDROCODONE-ACETAMINOPHEN 5-325 MG PO TABS
1.0000 | ORAL_TABLET | Freq: Four times a day (QID) | ORAL | Status: DC | PRN
  Administered 2014-12-25: 1 via ORAL
  Administered 2014-12-26 – 2014-12-27 (×4): 2 via ORAL
  Filled 2014-12-23: qty 1
  Filled 2014-12-23 (×3): qty 2
  Filled 2014-12-23 (×3): qty 1
  Filled 2014-12-23: qty 2
  Filled 2014-12-23: qty 1

## 2014-12-23 MED ORDER — PROMETHAZINE HCL 25 MG/ML IJ SOLN: 6.2500 mg | INTRAMUSCULAR | Status: DC | PRN

## 2014-12-23 MED ORDER — CEFAZOLIN SODIUM-DEXTROSE 2-3 GM-% IV SOLR
INTRAVENOUS | Status: AC
  Filled 2014-12-23: qty 50

## 2014-12-23 MED ORDER — ONDANSETRON HCL 4 MG/2ML IJ SOLN
INTRAMUSCULAR | Status: AC
  Filled 2014-12-23: qty 2

## 2014-12-23 MED ORDER — FENTANYL CITRATE 0.05 MG/ML IJ SOLN
25.0000 ug | INTRAMUSCULAR | Status: DC | PRN
Start: 2014-12-23 — End: 2014-12-23

## 2014-12-23 MED ORDER — PROPOFOL 10 MG/ML IV BOLUS
INTRAVENOUS | Status: DC | PRN
  Administered 2014-12-23: 100 mg via INTRAVENOUS

## 2014-12-23 MED ORDER — ONDANSETRON HCL 4 MG PO TABS: 4.0000 mg | ORAL_TABLET | Freq: Four times a day (QID) | ORAL | Status: DC | PRN

## 2014-12-23 MED ORDER — MIDAZOLAM HCL 5 MG/5ML IJ SOLN
INTRAMUSCULAR | Status: DC | PRN
  Administered 2014-12-23: 1 mg via INTRAVENOUS

## 2014-12-23 MED ORDER — LACTATED RINGERS IV SOLN: INTRAVENOUS | Status: DC

## 2014-12-23 MED ORDER — NEOSTIGMINE METHYLSULFATE 10 MG/10ML IV SOLN
INTRAVENOUS | Status: DC | PRN
  Administered 2014-12-23: 4 mg via INTRAVENOUS

## 2014-12-23 SURGICAL SUPPLY — 42 items
BAG SPEC THK2 15X12 ZIP CLS (MISCELLANEOUS)
BAG ZIPLOCK 12X15 (MISCELLANEOUS) ×1 IMPLANT
BIT DRILL CANN LG 4.3MM (BIT) IMPLANT
COVER MAYO STAND STRL (DRAPES) ×1 IMPLANT
COVER SURGICAL LIGHT HANDLE (MISCELLANEOUS) ×2 IMPLANT
DRAPE BACK TABLE (DRAPES) ×2 IMPLANT
DRAPE STERI IOBAN 125X83 (DRAPES) ×2 IMPLANT
DRILL BIT CANN LG 4.3MM (BIT) ×2
DRSG AQUACEL AG ADV 3.5X 4 (GAUZE/BANDAGES/DRESSINGS) ×2 IMPLANT
DRSG AQUACEL AG ADV 3.5X 6 (GAUZE/BANDAGES/DRESSINGS) ×1 IMPLANT
DRSG MEPILEX BORDER 4X4 (GAUZE/BANDAGES/DRESSINGS) ×2 IMPLANT
DRSG PAD ABDOMINAL 8X10 ST (GAUZE/BANDAGES/DRESSINGS) ×4 IMPLANT
DURAPREP 26ML APPLICATOR (WOUND CARE) ×2 IMPLANT
ELECT REM PT RETURN 9FT ADLT (ELECTROSURGICAL) ×2
ELECTRODE REM PT RTRN 9FT ADLT (ELECTROSURGICAL) ×1 IMPLANT
GAUZE SPONGE 4X4 12PLY STRL (GAUZE/BANDAGES/DRESSINGS) ×1 IMPLANT
GLOVE BIOGEL PI IND STRL 8.5 (GLOVE) ×1 IMPLANT
GLOVE BIOGEL PI INDICATOR 8.5 (GLOVE) ×1
GLOVE ECLIPSE 8.5 STRL (GLOVE) ×2 IMPLANT
GLOVE SURG SS PI 8.5 STRL IVOR (GLOVE) ×3
GLOVE SURG SS PI 8.5 STRL STRW (GLOVE) IMPLANT
GOWN STRL REUS W/TWL 2XL LVL3 (GOWN DISPOSABLE) ×2 IMPLANT
GOWN STRL REUS W/TWL XL LVL3 (GOWN DISPOSABLE) ×2 IMPLANT
GUIDEPIN 3.2X17.5 THRD DISP (PIN) ×1 IMPLANT
KIT BASIN OR (CUSTOM PROCEDURE TRAY) ×2 IMPLANT
MANIFOLD NEPTUNE II (INSTRUMENTS) ×2 IMPLANT
NAIL HIP FRACT 130D 11X180 (Screw) ×1 IMPLANT
NEEDLE HYPO 22GX1.5 SAFETY (NEEDLE) ×1 IMPLANT
PACK GENERAL/GYN (CUSTOM PROCEDURE TRAY) ×2 IMPLANT
PAD CAST 4YDX4 CTTN HI CHSV (CAST SUPPLIES) ×1 IMPLANT
PADDING CAST COTTON 4X4 STRL (CAST SUPPLIES)
PADDING CAST SYN 6 (CAST SUPPLIES)
PADDING CAST SYNTHETIC 6X4 NS (CAST SUPPLIES) ×1 IMPLANT
POSITIONER SURGICAL ARM (MISCELLANEOUS) ×6 IMPLANT
SCREW BONE CORTICAL 5.0X36 (Screw) ×1 IMPLANT
SCREW LAG 10.5MMX105MM HFN (Screw) ×1 IMPLANT
STAPLER VISISTAT 35W (STAPLE) ×2 IMPLANT
SUT BONE WAX W31G (SUTURE) ×1 IMPLANT
SUT VIC AB 1 CT1 36 (SUTURE) ×4 IMPLANT
SUT VIC AB 2-0 CT1 18 (SUTURE) ×2 IMPLANT
SYR CONTROL 10ML LL (SYRINGE) ×1 IMPLANT
TOWEL OR 17X26 10 PK STRL BLUE (TOWEL DISPOSABLE) ×5 IMPLANT

## 2014-12-23 NOTE — Progress Notes (Signed)
ANTICOAGULATION CONSULT NOTE - Initial Consult  Pharmacy Consult for Warfarin Indication: VTE prophylaxis  Allergies  Allergen Reactions  . Chocolate Rash    If I eat too much    Patient Measurements:     Vital Signs: Temp: 97.6 F (36.4 C) (12/24 2100) Temp Source: Oral (12/24 1326) BP: 157/54 mmHg (12/24 2100) Pulse Rate: 55 (12/24 2100)  Labs:  Recent Labs  12/23/14 1530  HGB 10.9*  HCT 34.5*  PLT 245  LABPROT 22.1*  INR 1.91*  CREATININE 1.03    CrCl cannot be calculated (Unknown ideal weight.).   Medical History: Past Medical History  Diagnosis Date  . Hypertension   . Diabetes mellitus   . Osteoarthritis     r knee  . GERD (gastroesophageal reflux disease)   . Osteopenia   . Hearing loss   . Peripheral neuropathy   . Carotid stenosis     right  . Dermoid tumor     arm  . DVT (deep venous thrombosis) 04/30/2013  . Clotting disorder     Medications:  Scheduled:  .  ceFAZolin (ANCEF) IV  2 g Intravenous Q6H  . [START ON 12/24/2014] ferrous sulfate  325 mg Oral TID PC    Assessment: 80 y.o. female hx of HTN, DM, GERD, DVT on coumadin, presented to [facility] ED after an episode of fall at home.  Ortho took to OR for an intramedullary nail fixation tonight.  Pharmacy consulted to dose warfarin for VTE prophylaxis.  Home dose: 2.5mg  six days a week and 5mg  one day a week. LD 12/23 (5mg ) INR 1.91 No Vitamin K given Mechanical  SCD's in place  Goal of Therapy:  INR 2-3  Plan:   Hold warfarin tonight due to late ortho surgery  Daily Pt/INR  Dolly Rias RPh 12/23/2014, 9:54 PM Pager [phone]

## 2014-12-23 NOTE — Anesthesia Preprocedure Evaluation (Signed)
Anesthesia Evaluation  Patient identified by MRN, date of birth, ID band Patient awake    Reviewed: Allergy & Precautions, H&P , NPO status , Patient's Chart, lab work & pertinent test results  Airway Mallampati: II  TM Distance: >3 FB Neck ROM: Full    Dental no notable dental hx.    Pulmonary neg pulmonary ROS,  breath sounds clear to auscultation  Pulmonary exam normal       Cardiovascular hypertension, Pt. on medications + Peripheral Vascular Disease negative cardio ROS  Rhythm:Regular Rate:Normal     Neuro/Psych negative neurological ROS  negative psych ROS   GI/Hepatic negative GI ROS, Neg liver ROS,   Endo/Other  negative endocrine ROSdiabetes, Type 2  Renal/GU negative Renal ROS  negative genitourinary   Musculoskeletal negative musculoskeletal ROS (+)   Abdominal   Peds negative pediatric ROS (+)  Hematology negative hematology ROS (+)   Anesthesia Other Findings   Reproductive/Obstetrics negative OB ROS                             Anesthesia Physical Anesthesia Plan  ASA: II  Anesthesia Plan: General   Post-op Pain Management:    Induction: Intravenous, Rapid sequence and Cricoid pressure planned  Airway Management Planned: Oral ETT  Additional Equipment:   Intra-op Plan:   Post-operative Plan: Extubation in OR  Informed Consent: I have reviewed the patients History and Physical, chart, labs and discussed the procedure including the risks, benefits and alternatives for the proposed anesthesia with the patient or authorized representative who has indicated his/her understanding and acceptance.   Dental advisory given  Plan Discussed with: CRNA  Anesthesia Plan Comments: (Pt on coumadin. Not a candidate for SAB. NPO since noon. RSI)        Anesthesia Quick Evaluation

## 2014-12-23 NOTE — Transfer of Care (Signed)
Immediate Anesthesia Transfer of Care Note  Patient: Kelly Mercer  Procedure(s) Performed: Procedure(s): INTRAMEDULLARY (IM) NAIL FEMORAL (Right)  Patient Location: PACU  Anesthesia Type:General  Level of Consciousness: awake, alert  and oriented  Airway & Oxygen Therapy: Patient Spontanous Breathing and Patient connected to face mask oxygen  Post-op Assessment: Report given to PACU RN and Post -op Vital signs reviewed and stable  Post vital signs: Reviewed and stable  Complications: No apparent anesthesia complications

## 2014-12-23 NOTE — ED Notes (Signed)
I.V team tried to stick and nurse tried , Lab called to draw paient blood by karen.Marland [email or web address]

## 2014-12-23 NOTE — H&P (Signed)
[facility] History and Physical  Kelly Mercer BJY:[id number] DOB: Aug 16, 1926 DOA: 12/23/2014  Referring physician: ED physician PCP: Jerlyn Ly, MD   Chief Complaint: fall at home   HPI:  Pt is 80 y.o. female hx of HTN, DM, GERD, DVT on coumadin, presented to [facility] ED after an episode of fall at home, ? Syncope but no LOC. Pt denies any prodromal events, no similar events in the past. She denies chest pain or shortness of breath, no dizziness, no abd or urinary concerns. In ED, XRAY notable for R hip fracture and TRH asked to admit to medical floor for further evaluation and management of hip fracture. Ortho team consulted and to take to OR tonight.   Assessment and Plan: Active Problems: Right hip fracture - admit to medical floor - pt clear for surgery from IM standpoint - analgesia as needed - hold Coumadin for now, may need to hold off post op as well as pt may have more bleeding - pt made aware and verbalized understanding  Leukocytosis - no clear sign of an infectious etiology - UA, urine culture, pending - CXR with no signs of an infectious etiology  - hold off on ABX for now - repeat CBC in AM HTN - hold BP meds from home for now - may resume post op in AM DVT - on Coumadin at home but will hold for now pre op - may need to hold post op depending on Hg/Hct/Plt  Radiological Exams on Admission: Dg Chest 1 View  12/23/2014   CLINICAL DATA:  80 year old female with syncope and fall  EXAM: CHEST - 1 VIEW  COMPARISON:  None.  FINDINGS: Cardiac and mediastinal contours are within normal limits. Trace atherosclerotic vascular calcifications in the transverse aorta. The lungs are clear. No pneumothorax, pleural effusion, pulmonary edema or focal consolidation. No suspicious pulmonary mass or nodule. No acute osseous abnormality. Surgical clips in the right upper quadrant suggest prior cholecystectomy.  IMPRESSION: 1. No active cardiopulmonary process. 2. Aortic  atherosclerosis.   Electronically Signed   By: Jacqulynn Cadet M.D.   On: 12/23/2014 15:05   Dg Pelvis 1-2 Views  12/23/2014   CLINICAL DATA:  Fall today.  Right hip pain.  EXAM: PELVIS - 1-2 VIEW  COMPARISON:  None.  FINDINGS: Examination demonstrates a very minimally displaced intertrochanteric fracture of the right femur. There are moderate degenerative changes of the right hip and mild degenerative changes of the left hip. Mild degenerate change of the spine.  IMPRESSION: Minimally displaced intertrochanteric fracture right hip.  Osteoarthritis of the hips right worse than left.   Electronically Signed   By: Marin Olp M.D.   On: 12/23/2014 15:06   Dg Femur Right  12/23/2014   CLINICAL DATA:  [place] today, syncope following a dizzy spell, RIGHT hip pain, unable to rotate RIGHT lower extremity  EXAM: RIGHT FEMUR - 2 VIEW  COMPARISON:  None  FINDINGS: Osseous demineralization.  Joint space narrowing at knee joint.  Probable nondisplaced intertrochanteric fracture RIGHT femur.  Osteoarthritic changes RIGHT hip joint.  Visualized RIGHT pelvis intact.  No additional fracture dislocation seen.  IMPRESSION: Osseous demineralization with osteoarthritic changes RIGHT hip.  Probable nondisplaced intertrochanteric fracture RIGHT femur.   Electronically Signed   By: Lavonia Dana M.D.   On: 12/23/2014 15:07     Code Status: Full Family Communication: Pt at bedside, son over the phone  Disposition Plan: Admit for further evaluation     Review of Systems:  Constitutional: Negative  for fever, chills and malaise/fatigue. Negative for diaphoresis.  HENT: Negative for hearing loss, ear pain, nosebleeds, congestion, sore throat, neck pain, tinnitus and ear discharge.   Eyes: Negative for blurred vision, double vision, photophobia, pain, discharge and redness.  Respiratory: Negative for cough, hemoptysis, sputum production, shortness of breath, wheezing and stridor.   Cardiovascular: Negative for chest pain,  palpitations, orthopnea, claudication and leg swelling.  Gastrointestinal: Negative for nausea, vomiting and abdominal pain. Genitourinary: Negative for dysuria, urgency, frequency, hematuria and flank pain.  Musculoskeletal: Negative for myalgias.  Skin: Negative for itching and rash.  Neurological: Negative for dizziness and weakness.  Endo/Heme/Allergies: Negative for environmental allergies and polydipsia. Does not bruise/bleed easily.  Psychiatric/Behavioral: Negative for suicidal ideas. The patient is not nervous/anxious.      Past Medical History  Diagnosis Date  . Hypertension   . Diabetes mellitus   . Osteoarthritis     r knee  . GERD (gastroesophageal reflux disease)   . Osteopenia   . Hearing loss   . Peripheral neuropathy   . Carotid stenosis     right  . Dermoid tumor     arm  . DVT (deep venous thrombosis) 04/30/2013  . Clotting disorder     Past Surgical History  Procedure Laterality Date  . Abdominal hysterectomy    . Desmoid tumor resection    . Cataracts Bilateral   . Cholecystectomy    . Ivc filter  05/11/13    Social History:  reports that she has never smoked. She does not have any smokeless tobacco history on file. She reports that she does not drink alcohol or use illicit drugs.  Allergies  Allergen Reactions  . Chocolate Rash    If I eat too much    Family History  Problem Relation Age of Onset  . Diabetes Mother   . Stroke Father     Prior to Admission medications   Medication Sig Start Date End Date Taking? Authorizing Provider  acetaminophen (TYLENOL) 500 MG tablet Take 500 mg by mouth every 6 (six) hours as needed for moderate pain.   Yes Historical Provider, MD  fenofibrate 160 MG tablet Take 160 mg by mouth daily.     Yes Historical Provider, MD  gabapentin (NEURONTIN) 100 MG capsule Take 200 mg by mouth 3 (three) times daily.    Yes Historical Provider, MD  losartan (COZAAR) 50 MG tablet Take 50 mg by mouth daily.    Yes Historical  Provider, MD  propranolol-hydrochlorothiazide (INDERIDE) 80-25 MG per tablet Take 0.5 tablets by mouth daily.    Yes Historical Provider, MD  warfarin (COUMADIN) 2.5 MG tablet Take 2.5 mg by mouth. Six days out of the week at night.   Yes Historical Provider, MD  warfarin (COUMADIN) 5 MG tablet Take 5 mg by mouth once a week. Takes one day out of week.   Yes Historical Provider, MD    Physical Exam: Filed Vitals:   12/23/14 1326 12/23/14 1659  BP: 122/49 163/55  Pulse: 57 63  Temp: 97.6 F (36.4 C)   TempSrc: Oral   Resp: 18 18  SpO2: 98% 100%    Physical Exam  Constitutional: Appears well-developed and well-nourished. No distress.  HENT: Normocephalic. External right and left ear normal. Oropharynx is clear and moist.  Eyes: Conjunctivae and EOM are normal. PERRLA, no scleral icterus.  Neck: Normal ROM. Neck supple. No JVD. No tracheal deviation. No thyromegaly.  CVS: RRR, S1/S2 +, no gallops, no carotid bruit.  Pulmonary: Effort  and breath sounds normal, no stridor, rhonchi, wheezes, rales.  Abdominal: Soft. BS +,  no distension, tenderness, rebound or guarding.  Musculoskeletal: Normal range of motion. TTP in right hip area  Lymphadenopathy: No lymphadenopathy noted, cervical, inguinal. Neuro: Alert. Normal reflexes, muscle tone coordination. No cranial nerve deficit. Skin: Skin is warm and dry. No rash noted. Not diaphoretic. No erythema. No pallor.  Psychiatric: Normal mood and affect. Behavior, judgment, thought content normal.   Labs on Admission:  Basic Metabolic Panel:  Recent Labs Lab 12/23/14 1530  NA 139  K 3.9  CL 107  CO2 26  GLUCOSE 122*  BUN 36*  CREATININE 1.03  CALCIUM 9.4   Liver Function Tests:  Recent Labs Lab 12/23/14 1530  AST 28  ALT 13  ALKPHOS 43  BILITOT 0.7  PROT 6.7  ALBUMIN 3.9   CBC:  Recent Labs Lab 12/23/14 1530  WBC 11.9*  NEUTROABS 10.1*  HGB 10.9*  HCT 34.5*  MCV 98.6  PLT 245    EKG: Normal sinus rhythm, no  ST/T wave changes  Faye Ramsay, MD  [facility] Pager [phone]  If 7PM-7AM, please contact night-coverage [email or web address] Password [email or web address] 12/23/2014, 5:29 PM

## 2014-12-23 NOTE — ED Notes (Signed)
Pt was coming inside house from taking her garbage out when she fell onto her right side. Pt c/o right leg pain from kee to hip when she moves it. Pt does take warfarin.

## 2014-12-23 NOTE — ED Notes (Signed)
This Agricultural consultant unsuccessfully attempted to start an IV x 2.

## 2014-12-23 NOTE — Consult Note (Signed)
Kelly Ly, MD Chief Complaint: fall History: Patient fell at home.  Question of syncopal event but no LOC.  Unable to ambulate - xrays demonstrate right hip fracture. Past Medical History  Diagnosis Date  . Hypertension   . Diabetes mellitus   . Osteoarthritis     r knee  . GERD (gastroesophageal reflux disease)   . Osteopenia   . Hearing loss   . Peripheral neuropathy   . Carotid stenosis     right  . Dermoid tumor     arm  . DVT (deep venous thrombosis) 04/30/2013  . Clotting disorder     Allergies  Allergen Reactions  . Chocolate Rash    If I eat too much    No current facility-administered medications on file prior to encounter.   Current Outpatient Prescriptions on File Prior to Encounter  Medication Sig Dispense Refill  . fenofibrate 160 MG tablet Take 160 mg by mouth daily.      Marland Kitchen gabapentin (NEURONTIN) 100 MG capsule Take 200 mg by mouth 3 (three) times daily.     Marland Kitchen losartan (COZAAR) 50 MG tablet Take 50 mg by mouth daily.     . propranolol-hydrochlorothiazide (INDERIDE) 80-25 MG per tablet Take 0.5 tablets by mouth daily.       Physical Exam: Filed Vitals:   12/23/14 1659  BP: 163/55  Pulse: 63  Temp:   Resp: 18  A+O X3 NVI Compartments soft/nt 1+ DP/PT pulses No sob/CP abd soft/nt Right groin/hip pain.  No laceration or abrasion   Image: Dg Chest 1 View  12/23/2014   CLINICAL DATA:  80 year old female with syncope and fall  EXAM: CHEST - 1 VIEW  COMPARISON:  None.  FINDINGS: Cardiac and mediastinal contours are within normal limits. Trace atherosclerotic vascular calcifications in the transverse aorta. The lungs are clear. No pneumothorax, pleural effusion, pulmonary edema or focal consolidation. No suspicious pulmonary mass or nodule. No acute osseous abnormality. Surgical clips in the right upper quadrant suggest prior cholecystectomy.  IMPRESSION: 1. No active cardiopulmonary process. 2. Aortic atherosclerosis.   Electronically Signed   By:  Jacqulynn Cadet M.D.   On: 12/23/2014 15:05   Dg Pelvis 1-2 Views  12/23/2014   CLINICAL DATA:  Fall today.  Right hip pain.  EXAM: PELVIS - 1-2 VIEW  COMPARISON:  None.  FINDINGS: Examination demonstrates a very minimally displaced intertrochanteric fracture of the right femur. There are moderate degenerative changes of the right hip and mild degenerative changes of the left hip. Mild degenerate change of the spine.  IMPRESSION: Minimally displaced intertrochanteric fracture right hip.  Osteoarthritis of the hips right worse than left.   Electronically Signed   By: Marin Olp M.D.   On: 12/23/2014 15:06   Dg Femur Right  12/23/2014   CLINICAL DATA:  [place] today, syncope following a dizzy spell, RIGHT hip pain, unable to rotate RIGHT lower extremity  EXAM: RIGHT FEMUR - 2 VIEW  COMPARISON:  None  FINDINGS: Osseous demineralization.  Joint space narrowing at knee joint.  Probable nondisplaced intertrochanteric fracture RIGHT femur.  Osteoarthritic changes RIGHT hip joint.  Visualized RIGHT pelvis intact.  No additional fracture dislocation seen.  IMPRESSION: Osseous demineralization with osteoarthritic changes RIGHT hip.  Probable nondisplaced intertrochanteric fracture RIGHT femur.   Electronically Signed   By: Lavonia Dana M.D.   On: 12/23/2014 15:07    A/P:  Patient with question syncopal episode who fell from standing height and has been unable to ambulate.  Significant right hip  pain.  Xrays demonstrate right IT fracture. Discussed risks with patient - infection, bleeding, blood clots, stroke, death, need for further surgery, ongoing or worse pain, non-union or mal-union. Per ER staff - medical team aware and have cleared patient for surgery.

## 2014-12-23 NOTE — Anesthesia Postprocedure Evaluation (Signed)
  Anesthesia Post-op Note  Patient: Kelly Mercer  Procedure(s) Performed: Procedure(s) (LRB): INTRAMEDULLARY (IM) NAIL FEMORAL (Right)  Patient Location: PACU  Anesthesia Type: General  Level of Consciousness: awake and alert   Airway and Oxygen Therapy: Patient Spontanous Breathing  Post-op Pain: mild  Post-op Assessment: Post-op Vital signs reviewed, Patient's Cardiovascular Status Stable, Respiratory Function Stable, Patent Airway and No signs of Nausea or vomiting  Last Vitals:  Filed Vitals:   12/23/14 2100  BP: 157/54  Pulse: 55  Temp: 36.4 C  Resp: 15    Post-op Vital Signs: stable   Complications: No apparent anesthesia complications

## 2014-12-23 NOTE — ED Provider Notes (Signed)
CSN: [id number]     Arrival date & time 12/23/14  1316 History   First MD Initiated Contact with Patient 12/23/14 1340     Chief Complaint  Patient presents with  . Fall  . Leg Pain     (Consider location/radiation/quality/duration/timing/severity/associated sxs/prior Treatment) The history is provided by the patient.  Kelly Mercer is a 80 y.o. female hx of HTN, DM, GERD, DVT on coumadin here with fall. She took her garbage out today and then came home and twisted and fell on right hip. No head injury. No LOC or syncope. Take coumadin for DVT.    Past Medical History  Diagnosis Date  . Hypertension   . Diabetes mellitus   . Osteoarthritis     r knee  . GERD (gastroesophageal reflux disease)   . Osteopenia   . Hearing loss   . Peripheral neuropathy   . Carotid stenosis     right  . Dermoid tumor     arm  . DVT (deep venous thrombosis) 04/30/2013  . Clotting disorder    Past Surgical History  Procedure Laterality Date  . Abdominal hysterectomy    . Desmoid tumor resection    . Cataracts Bilateral   . Cholecystectomy    . Ivc filter  05/11/13   Family History  Problem Relation Age of Onset  . Diabetes Mother   . Stroke Father    History  Substance Use Topics  . Smoking status: Never Smoker   . Smokeless tobacco: Not on file  . Alcohol Use: No   OB History    No data available     Review of Systems  Musculoskeletal:       R hip pain   All other systems reviewed and are negative.     Allergies  Chocolate  Home Medications   Prior to Admission medications   Medication Sig Start Date End Date Taking? Authorizing Provider  acetaminophen (TYLENOL) 500 MG tablet Take 500 mg by mouth every 6 (six) hours as needed for moderate pain.   Yes Historical Provider, MD  fenofibrate 160 MG tablet Take 160 mg by mouth daily.     Yes Historical Provider, MD  gabapentin (NEURONTIN) 100 MG capsule Take 200 mg by mouth 3 (three) times daily.    Yes Historical  Provider, MD  losartan (COZAAR) 50 MG tablet Take 50 mg by mouth daily.    Yes Historical Provider, MD  propranolol-hydrochlorothiazide (INDERIDE) 80-25 MG per tablet Take 0.5 tablets by mouth daily.    Yes Historical Provider, MD  warfarin (COUMADIN) 2.5 MG tablet Take 2.5 mg by mouth. Six days out of the week at night.   Yes Historical Provider, MD  warfarin (COUMADIN) 5 MG tablet Take 5 mg by mouth once a week. Takes one day out of week.   Yes Historical Provider, MD   BP 122/49 mmHg  Pulse 57  Temp(Src) 97.6 F (36.4 C) (Oral)  Resp 18  SpO2 98% Physical Exam  Constitutional: She is oriented to person, place, and time.  Uncomfortable   HENT:  Head: Normocephalic and atraumatic.  Mouth/Throat: Oropharynx is clear and moist.  Eyes: Conjunctivae and EOM are normal. Pupils are equal, round, and reactive to light.  Neck: Normal range of motion.  Cardiovascular: Regular rhythm and normal heart sounds.   Pulmonary/Chest: Effort normal and breath sounds normal. No respiratory distress. She has no wheezes.  Abdominal: Soft. She exhibits no distension. There is no tenderness. There is no rebound.  Musculoskeletal:  R hip dec ROM, + tender on palpation. Mild tenderness R proximal femur as well. No other injuries. 2+ pulses   Neurological: She is alert and oriented to person, place, and time. No cranial nerve deficit. Coordination normal.  Skin: Skin is dry.  Psychiatric: She has a normal mood and affect. Her behavior is normal. Judgment and thought content normal.  Nursing note and vitals reviewed.   ED Course  Procedures (including critical care time) Labs Review Labs Reviewed  CBC WITH DIFFERENTIAL  COMPREHENSIVE METABOLIC PANEL  PROTIME-INR  TYPE AND SCREEN    Imaging Review Dg Chest 1 View  12/23/2014   CLINICAL DATA:  80 year old female with syncope and fall  EXAM: CHEST - 1 VIEW  COMPARISON:  None.  FINDINGS: Cardiac and mediastinal contours are within normal limits. Trace  atherosclerotic vascular calcifications in the transverse aorta. The lungs are clear. No pneumothorax, pleural effusion, pulmonary edema or focal consolidation. No suspicious pulmonary mass or nodule. No acute osseous abnormality. Surgical clips in the right upper quadrant suggest prior cholecystectomy.  IMPRESSION: 1. No active cardiopulmonary process. 2. Aortic atherosclerosis.   Electronically Signed   By: Jacqulynn Cadet M.D.   On: 12/23/2014 15:05   Dg Pelvis 1-2 Views  12/23/2014   CLINICAL DATA:  Fall today.  Right hip pain.  EXAM: PELVIS - 1-2 VIEW  COMPARISON:  None.  FINDINGS: Examination demonstrates a very minimally displaced intertrochanteric fracture of the right femur. There are moderate degenerative changes of the right hip and mild degenerative changes of the left hip. Mild degenerate change of the spine.  IMPRESSION: Minimally displaced intertrochanteric fracture right hip.  Osteoarthritis of the hips right worse than left.   Electronically Signed   By: Marin Olp M.D.   On: 12/23/2014 15:06   Dg Femur Right  12/23/2014   CLINICAL DATA:  [place] today, syncope following a dizzy spell, RIGHT hip pain, unable to rotate RIGHT lower extremity  EXAM: RIGHT FEMUR - 2 VIEW  COMPARISON:  None  FINDINGS: Osseous demineralization.  Joint space narrowing at knee joint.  Probable nondisplaced intertrochanteric fracture RIGHT femur.  Osteoarthritic changes RIGHT hip joint.  Visualized RIGHT pelvis intact.  No additional fracture dislocation seen.  IMPRESSION: Osseous demineralization with osteoarthritic changes RIGHT hip.  Probable nondisplaced intertrochanteric fracture RIGHT femur.   Electronically Signed   By: Lavonia Dana M.D.   On: 12/23/2014 15:07     EKG Interpretation None      MDM   Final diagnoses:  [name] is a 80 y.o. female here with R hip pain s/p fall. Concerned for hip fracture. Will get preop labs and xrays.    4:05 PM  Xray showed R intertrochanteric  fracture. Called Dr. Rolena Infante, who will do surgery at [facility] today. Will need hospitalist admission.    Wandra Arthurs, MD 12/23/14 [phone]

## 2014-12-23 NOTE — Brief Op Note (Signed)
12/23/2014  7:55 PM  PATIENT:  Kelly Mercer  80 y.o. female  PRE-OPERATIVE DIAGNOSIS:  FRACTURED RIGHT HIP  POST-OPERATIVE DIAGNOSIS:  * No post-op diagnosis entered *  PROCEDURE:  Procedure(s): INTRAMEDULLARY (IM) NAIL FEMORAL (Right)  SURGEON:  Surgeon(s) and Role:    * Melina Schools, MD - Primary  PHYSICIAN ASSISTANT:   ASSISTANTS: none   ANESTHESIA:   general  EBL:  Total I/O In: -  Out: 700 [Urine:600; Blood:100]  BLOOD ADMINISTERED:none  DRAINS: none   LOCAL MEDICATIONS USED:  none  SPECIMEN:  No Specimen  DISPOSITION OF SPECIMEN:  N/A  COUNTS:  YES  TOURNIQUET:  * No tourniquets in log *  DICTATION: .Other Dictation: Dictation Number [id number]  PLAN OF CARE: Admit to inpatient   PATIENT DISPOSITION:  PACU - hemodynamically stable.

## 2014-12-23 NOTE — Progress Notes (Signed)
CSW met with patient at bedside.Patient states that he stays home alone in [place]. Patient informed CSW that prior to coming into the ED he has been able to complete his ADL's independently.   Per chart, patient presents to the ED because of fall. Patient complains of right leg pain. Patient states that he is not feeling any better than when he initially came to the ED.  Willette Brace [phone] ED CSW 12/23/2014 4:46 PM

## 2014-12-23 NOTE — Op Note (Signed)
NAMESEQUOYAH, RAMONE NO.:  [id number]  MEDICAL RECORD NO.:  [id number]  LOCATION:  [facility]                         FACILITY:  [facility]  PHYSICIAN:  Dahlia Bailiff, MD    DATE OF BIRTH:  09-11-1926  DATE OF PROCEDURE:  12/23/2014 DATE OF DISCHARGE:                              OPERATIVE REPORT   PREOPERATIVE DIAGNOSIS:  Right intertrochanteric hip fracture.  POSTOPERATIVE DIAGNOSIS:  Right intertrochanteric hip fracture.  OPERATIVE PROCEDURE:  Intramedullary nail fixation with Biomet trochanteric nail.  COMPLICATIONS:  None.  CONDITION:  Stable.  HISTORY:  Kelly is a very pleasant 80 year old Mercer who fell earlier today and presented to the emergency room with a hip fracture.  After discussing options, she elected to proceed with surgery.  All appropriate risks, benefits, and alternatives were discussed with the patient and consent was obtained.  OPERATIVE NOTE:  The patient was brought to the operating room, placed supine on the operating table.  After successful induction of general anesthesia and endotracheal intubation, a Foley was placed and she was placed supine on the Hana bed.  The right lower extremity was placed into the traction, the left was placed in the flexed and abducted position.  A gentle reduction maneuver was performed and the leg was locked in place.  The right lower extremity was then prepped and draped in standard fashion.  Time-out was taken confirming patient, procedure, and all other pertinent important data.  Once Kelly was completed, a small incision was made at the leading edge of the greater trochanter and guide pin was advanced down to the tip of the greater trochanter.  I advanced Kelly down through the trochanter and into the proximal femur.  I confirmed satisfactory positioning in both the AP and lateral planes.  The one-step reaming drill was then placed over the guide pin and advanced to the lesser trochanter.  The nail  itself was then inserted and malleted down to the appropriate depth.  Once it was down to appropriate depth, the second incision was made on the lateral side of the femur.  The guide pin for the lag screw was inserted.  I then advanced the drill through the guide into the femur through the nail and into the femoral neck and head.  I confirmed satisfactory positioning in both planes.  Once the trajectory was finalized, I measured and then over drilled the guide pin for 105 mm length screw.  The screw was advanced and had excellent purchase.  It was then locked in place and then back turned a quarter so they could compress.  I then placed the targeting device for the distal screw and then made a third incision and placed it along the lateral side of the femur, drilled, and placed a size 36 mm screw.  All screws had excellent purchase.  The jig was then removed.  Final x-rays were taken, they were satisfactory.  All wounds were irrigated copiously with normal saline, closed in layered fashion with #1 Vicryl suture, 2-0 Vicryl sutures and staples.  Dry dressing was applied.  The patient was extubated, transferred to PACU without incident.  At the end of the case, all needle and sponge counts were correct.  There were no adverse intraoperative events.     Dahlia Bailiff, MD     DDB/MEDQ  D:  12/23/2014  T:  12/23/2014  Job:  [id number]

## 2014-12-24 LAB — BASIC METABOLIC PANEL
Anion gap: 6 (ref 5–15)
BUN: 27 mg/dL — ABNORMAL HIGH (ref 6–23)
CO2: 24 mmol/L (ref 19–32)
Calcium: 8.1 mg/dL — ABNORMAL LOW (ref 8.4–10.5)
Chloride: 105 mEq/L (ref 96–112)
Creatinine, Ser: 0.88 mg/dL (ref 0.50–1.10)
GFR, EST AFRICAN AMERICAN: 67 mL/min — AB (ref >90)
GFR, EST NON AFRICAN AMERICAN: 58 mL/min — AB (ref >90)
GLUCOSE: 123 mg/dL — AB (ref 70–99)
POTASSIUM: 3.7 mmol/L (ref 3.5–5.1)
Sodium: 135 mmol/L (ref 135–145)

## 2014-12-24 LAB — PROTIME-INR
INR: 1.87 — AB (ref 0.00–1.49)
Prothrombin Time: 21.7 seconds — ABNORMAL HIGH (ref 11.6–15.2)

## 2014-12-24 LAB — CBC
HEMATOCRIT: 25.7 % — AB (ref 36.0–46.0)
Hemoglobin: 8.8 g/dL — ABNORMAL LOW (ref 12.0–15.0)
MCH: 33 pg (ref 26.0–34.0)
MCHC: 34.2 g/dL (ref 30.0–36.0)
MCV: 96.3 fL (ref 78.0–100.0)
Platelets: 180 10*3/uL (ref 150–400)
RBC: 2.67 MIL/uL — ABNORMAL LOW (ref 3.87–5.11)
RDW: 12.9 % (ref 11.5–15.5)
WBC: 8 10*3/uL (ref 4.0–10.5)

## 2014-12-24 MED ORDER — FENOFIBRATE 160 MG PO TABS
160.0000 mg | ORAL_TABLET | Freq: Every day | ORAL | Status: DC
  Administered 2014-12-24 – 2014-12-27 (×4): 160 mg via ORAL
  Filled 2014-12-24 (×4): qty 1

## 2014-12-24 MED ORDER — HYDROCODONE-ACETAMINOPHEN 5-325 MG PO TABS: 1.0000 | ORAL_TABLET | Freq: Four times a day (QID) | ORAL | Status: DC | PRN

## 2014-12-24 MED ORDER — PROPRANOLOL-HCTZ 80-25 MG PO TABS: 0.5000 | ORAL_TABLET | Freq: Every day | ORAL | Status: DC

## 2014-12-24 MED ORDER — HYDROCHLOROTHIAZIDE 12.5 MG PO CAPS
12.5000 mg | ORAL_CAPSULE | Freq: Every day | ORAL | Status: DC
Start: 2014-12-24 — End: 2014-12-25
  Administered 2014-12-24 – 2014-12-25 (×2): 12.5 mg via ORAL
  Filled 2014-12-24 (×2): qty 1

## 2014-12-24 MED ORDER — PROPRANOLOL HCL 40 MG PO TABS
40.0000 mg | ORAL_TABLET | Freq: Every day | ORAL | Status: DC
  Administered 2014-12-24 – 2014-12-27 (×4): 40 mg via ORAL
  Filled 2014-12-24 (×4): qty 1

## 2014-12-24 MED ORDER — LIP MEDEX EX OINT
TOPICAL_OINTMENT | CUTANEOUS | Status: AC
  Administered 2014-12-24: 1
  Filled 2014-12-24: qty 7

## 2014-12-24 MED ORDER — WARFARIN - PHARMACIST DOSING INPATIENT: Freq: Every day | Status: DC

## 2014-12-24 MED ORDER — GABAPENTIN 100 MG PO CAPS
200.0000 mg | ORAL_CAPSULE | Freq: Three times a day (TID) | ORAL | Status: DC
Start: 2014-12-24 — End: 2014-12-27
  Administered 2014-12-24 – 2014-12-27 (×9): 200 mg via ORAL
  Filled 2014-12-24 (×11): qty 2

## 2014-12-24 MED ORDER — LOSARTAN POTASSIUM 50 MG PO TABS
50.0000 mg | ORAL_TABLET | Freq: Every day | ORAL | Status: DC
  Administered 2014-12-24 – 2014-12-25 (×2): 50 mg via ORAL
  Filled 2014-12-24 (×2): qty 1

## 2014-12-24 MED ORDER — WARFARIN SODIUM 2.5 MG PO TABS
2.5000 mg | ORAL_TABLET | Freq: Once | ORAL | Status: AC
  Administered 2014-12-24: 2.5 mg via ORAL
  Filled 2014-12-24: qty 1

## 2014-12-24 NOTE — Progress Notes (Signed)
[place] for Warfarin Indication: Hx of DVT; s/p hip fracture repair 12/23/14  Allergies  Allergen Reactions  . Chocolate Rash    If I eat too much    Patient Measurements: Height: 5\' 4"  (162.6 cm) Weight: 122 lb (55.339 kg) IBW/kg (Calculated) : 54.7   Vital Signs: Temp: 98.1 F (36.7 C) (12/25 0602) Temp Source: Oral (12/25 0602) BP: 107/51 mmHg (12/25 0602) Pulse Rate: 76 (12/25 0602)  Labs:  Recent Labs  12/23/14 1530 12/24/14 0515  HGB 10.9* 8.8*  HCT 34.5* 25.7*  PLT 245 180  LABPROT 22.1* 21.7*  INR 1.91* 1.87*  CREATININE 1.03 0.88    Estimated Creatinine Clearance: 39.6 mL/min (by C-G formula based on Cr of 0.88).   Medical History: Past Medical History  Diagnosis Date  . Hypertension   . Diabetes mellitus   . Osteoarthritis     r knee  . GERD (gastroesophageal reflux disease)   . Osteopenia   . Hearing loss   . Peripheral neuropathy   . Carotid stenosis     right  . Dermoid tumor     arm  . DVT (deep venous thrombosis) 04/30/2013  . Clotting disorder     Medications:  Scheduled:  .  ceFAZolin (ANCEF) IV  2 g Intravenous Q6H  . ferrous sulfate  325 mg Oral TID PC    Assessment: 80 y.o. female hx of HTN, DM, GERD, DVT, on warfarin, presented to [facility] ED after an episode of fall at home.  Ortho took to OR for an intramedullary nail fixation 12/24. Pre-op INR was 1.91. No vitamin K was administered. Pharmacy was consulted to dose warfarin while inpatient.  Home warfarin dose: 2.5mg  six days a week and 5mg  one day a week. LD 12/23 (5mg )   Goal of Therapy:  INR 2-3  Today, 12/25: Hgb low post-operatively.  Pltc WNL. No reports of overt bleeding in chart notes. INR subtherapeutic and falling after warfarin dose yesterday held due to surgery.  Plan:  1. Resume warfarin with 2.5 mg PO x 1 tonight at 6PM (as per PTA regimen). 2. Follow PT/INR daily while inpatient. 3. Needs close INR monitoring  following discharge (suggest first check no later than two days post-discharge).  Clayburn Pert, PharmD, BCPS Pager: [phone] 12/24/2014  6:48 AM

## 2014-12-24 NOTE — Progress Notes (Signed)
Occupational Therapy Evaluation Patient Details Name: Kelly Mercer MRN: [id number] DOB: 01/02/1926 Today's Date: 12/24/2014    History of Present Illness 80 y.o. female hx of HTN, DM, GERD, DVT on coumadin, presented to [facility] ED after an episode of fall at home, ? Syncope but no LOC. R hip fx s/p ORIF with IM nail.   Clinical Impression   PTA, pt lived alone independently. Pt making good progress, however, feel pt would benefit from rehab at SNF prior to D/C home. Pt will NOT have 24/7 S after D/C, which would be needed for safe D/C home. Discussed SNF recommendation and pt agreeable, but prefers to go home if able. Will follow acutely to facilitate safe D/C plan.    Follow Up Recommendations  SNF;Supervision/Assistance - 24 hour    Equipment Recommendations  3 in 1 bedside comode    Recommendations for Other Services       Precautions / Restrictions Precautions Precautions: Fall Restrictions Weight Bearing Restrictions: No      Mobility Bed Mobility               General bed mobility comments: not assessed. pt up in chair  Transfers Overall transfer level: Needs assistance Equipment used: Rolling walker (2 wheeled) Transfers: Sit to/from [employer] Sit to Stand: Min assist Stand pivot transfers: Min assist       General transfer comment: vc for hand placement and safety    Balance Overall balance assessment: Needs assistance   Sitting balance-Leahy Scale: Good     Standing balance support: During functional activity;Bilateral upper extremity supported Standing balance-Leahy Scale: Fair                              ADL Overall ADL's : Needs assistance/impaired     Grooming: Set up;Sitting   Upper Body Bathing: Set up;Sitting   Lower Body Bathing: Moderate assistance;Sit to/from stand   Upper Body Dressing : Set up;Sitting   Lower Body Dressing: Moderate assistance;Sit to/from stand   Toilet Transfer: Minimal  assistance   Toileting- Clothing Manipulation and Hygiene: Minimal assistance       Functional mobility during ADLs: Minimal assistance General ADL Comments: More limitations with LB ADL. May benefit from AE. Pt would need to be mod I to D/C home.     Vision                     Perception     Praxis      Pertinent Vitals/Pain Pain Assessment: No/denies pain     Hand Dominance Right   Extremity/Trunk Assessment Upper Extremity Assessment Upper Extremity Assessment: RUE deficits/detail RUE Deficits / Details: s/p tumor removal RUE; weakness for over 20 years; apparent RTC insufficiency   Lower Extremity Assessment Lower Extremity Assessment: RLE deficits/detail RLE Deficits / Details: R Im nail; R knee pain s/p fall   Cervical / Trunk Assessment Cervical / Trunk Assessment: Normal   Communication Communication Communication: HOH   Cognition Arousal/Alertness: Awake/alert Behavior During Therapy: WFL for tasks assessed/performed Overall Cognitive Status: Within Functional Limits for tasks assessed                     General Comments       Exercises       Shoulder Instructions      Home Living Family/patient expects to be discharged to:: Private residence Living Arrangements: Alone Available Help at Discharge: Family;Available PRN/intermittently  Type of Home: House Home Access: Stairs to enter [employer] of Steps: 2 Entrance Stairs-Rails: Right;Left Home Layout: One level     Bathroom Shower/Tub: Occupational psychologist: Standard Bathroom Accessibility: Yes How Accessible: Accessible via walker;Accessible via wheelchair Home Equipment: Gilford Rile - 2 wheels;Walker - 4 wheels;Shower seat - built in;Hand held shower head;Cane - quad;Cane - single point          Prior Functioning/Environment Level of Independence: Independent (used intermittently)             OT Diagnosis: Generalized weakness;Acute pain   OT  Problem List: Decreased strength;Decreased activity tolerance;Decreased range of motion;Decreased safety awareness;Decreased knowledge of use of DME or AE;Pain   OT Treatment/Interventions: Self-care/ADL training;Therapeutic exercise;DME and/or AE instruction;Therapeutic activities;Patient/family education    OT Goals(Current goals can be found in the care plan section) Acute Rehab OT Goals Patient Stated Goal: to go home OT Goal Formulation: With patient Time For Goal Achievement: 01/07/15 Potential to Achieve Goals: Good  OT Frequency: Min 2X/week   Barriers to D/C:            Co-evaluation              End of Session Equipment Utilized During Treatment: Gait belt;Rolling walker Nurse Communication: Mobility status  Activity Tolerance: Patient tolerated treatment well Patient left: in chair;with call bell/phone within reach;with family/visitor present   Time: 1032-1105 OT Time Calculation (min): 33 min Charges:  OT General Charges $OT Visit: 1 Procedure OT Evaluation $Initial OT Evaluation Tier I: 1 Procedure OT Treatments $Self Care/Home Management : 23-37 mins G-Codes:    Happy Begeman,HILLARY 01-09-2015, 11:16 AM   Maurie Boettcher, OTR/L  [phone] 09-Jan-2015

## 2014-12-24 NOTE — Evaluation (Signed)
Physical Therapy Evaluation Patient Details Name: Kelly Mercer MRN: [id number] DOB: July 02, 1926 Today's Date: 12/24/2014   History of Present Illness  80 y.o. female hx of HTN, DM, GERD, DVT on coumadin, presented to [facility] ED after an episode of fall at home, ? Syncope but no LOC. R hip fx s/p ORIF with IM nail.  Clinical Impression  Pt s/p R hip fx with IM nailing presents with decreased R LE strength/ROM and post op pain limiting functional mobility.  Pt states she lives alone but that her family is very supportive and will be able to assist.  However, pt states she understands if this is not possible, follow up rehab at SNF level is possible.    Follow Up Recommendations Home health PT;SNF    Equipment Recommendations       Recommendations for Other Services OT consult     Precautions / Restrictions Precautions Precautions: Fall Restrictions Weight Bearing Restrictions: No Other Position/Activity Restrictions: WBAT      Mobility  Bed Mobility Overal bed mobility: Needs Assistance Bed Mobility: Supine to Sit     Supine to sit: Mod assist     General bed mobility comments: Cues for sequence and use of L LE to self assist.  Physical assist to manage R LE and to bring trunk to upright position  Transfers Overall transfer level: Needs assistance Equipment used: Rolling walker (2 wheeled) Transfers: Sit to/from Stand Sit to Stand: Mod assist (from flat bed) Stand pivot transfers: Min assist       General transfer comment: cues for transition position and use of UEs to self assist  Ambulation/Gait Ambulation/Gait assistance: Min assist;Mod assist Ambulation Distance (Feet): 23 Feet Assistive device: Rolling walker (2 wheeled) Gait Pattern/deviations: Step-to pattern;Decreased step length - right;Decreased step length - left;Shuffle;Trunk flexed Gait velocity: decr   General Gait Details: cues for posture, sequence and position from [employer] Mobility    Modified Rankin (Stroke Patients Only)       Balance Overall balance assessment: Needs assistance   Sitting balance-Leahy Scale: Good     Standing balance support: Bilateral upper extremity supported Standing balance-Leahy Scale: Fair                               Pertinent Vitals/Pain Pain Assessment: 0-10 Pain Score: 2  Pain Location: R thigh and knee Pain Descriptors / Indicators: Sore Pain Intervention(s): Limited activity within patient's tolerance;Monitored during session;Premedicated before session;Ice applied    Home Living Family/patient expects to be discharged to:: Private residence Living Arrangements: Alone Available Help at Discharge: Family;Available PRN/intermittently Type of Home: House Home Access: Stairs to enter Entrance Stairs-Rails: Psychiatric nurse of Steps: 2 Home Layout: One level Home Equipment: Walker - 2 wheels;Walker - 4 wheels;Shower seat - built in;Hand held Veterinary surgeon - quad;Cane - single point      Prior Function Level of Independence: Independent               Hand Dominance   Dominant Hand: Right    Extremity/Trunk Assessment   Upper Extremity Assessment: RUE deficits/detail RUE Deficits / Details: s/p tumor removal RUE; weakness for over 20 years; apparent RTC insufficiency         Lower Extremity Assessment: RLE deficits/detail RLE Deficits / Details: 2/5 hip strength with AAROM at hip to 80 flex and 15 abd.  Cervical / Trunk Assessment: Normal  Communication   Communication: HOH  Cognition Arousal/Alertness: Awake/alert Behavior During Therapy: WFL for tasks assessed/performed Overall Cognitive Status: Within Functional Limits for tasks assessed                      General Comments      Exercises General Exercises - Lower Extremity Ankle Circles/Pumps: AROM;Both;15 reps;Supine Quad Sets: AROM;Both;10 reps;Supine Heel Slides:  AAROM;Right;15 reps;Supine Hip ABduction/ADduction: AAROM;Right;10 reps;Supine      Assessment/Plan    PT Assessment Patient needs continued PT services  PT Diagnosis Difficulty walking   PT Problem List Decreased strength;Decreased range of motion;Decreased activity tolerance;Decreased balance;Decreased mobility;Decreased knowledge of use of DME;Pain  PT Treatment Interventions DME instruction;Gait training;Stair training;Functional mobility training;Therapeutic exercise;Therapeutic activities;Patient/family education   PT Goals (Current goals can be found in the Care Plan section) Acute Rehab PT Goals Patient Stated Goal: to go home PT Goal Formulation: With patient Time For Goal Achievement: 01/07/15 Potential to Achieve Goals: Good    Frequency Min 6X/week   Barriers to discharge        Co-evaluation               End of Session Equipment Utilized During Treatment: Gait belt Activity Tolerance: Patient tolerated treatment well Patient left: in chair;with call bell/phone within reach Nurse Communication: Mobility status         Time: 0935-1005 PT Time Calculation (min) (ACUTE ONLY): 30 min   Charges:   PT Evaluation $Initial PT Evaluation Tier I: 1 Procedure PT Treatments $Gait Training: 8-22 mins $Therapeutic Exercise: 8-22 mins   PT G Codes:        Kelly Mercer 14-Jan-2015, 12:28 PM

## 2014-12-24 NOTE — Progress Notes (Signed)
Patient ID: Kelly Mercer, female   DOB: April 10, 1926, 80 y.o.   MRN: [id number]  [facility] PROGRESS NOTE  Kelly Mercer RCV:[id number] DOB: 1926-08-01 DOA: 12/23/2014 PCP: Jerlyn Ly, MD  Brief narrative: Pt is 80 y.o. female hx of HTN, DM, GERD, DVT on coumadin, presented to [facility] ED after an episode of fall at home, ? Syncope but no LOC. Pt denies any prodromal events, no similar events in the past. She denies chest pain or shortness of breath, no dizziness, no abd or urinary concerns. In ED, XRAY notable for R hip fracture and [facility] asked to admit to medical floor for further evaluation and management of hip fracture. Ortho team consulted and to take to OR tonight.   Assessment and Plan: Active Problems: Right hip fracture - s/p IM nailing, right femur, post op day #1 - reports feeling well this AM, reports mild pain in the right knee - up with PT, analgesia as needed Leukocytosis - no clear sign of an infectious etiology - CXR with no signs of an infectious etiology  - WBC WNL this AM Anemia of chronic iron deficiency  - drop in Hg likely post op in the setting of Coumadin use - monitor for signs of acute bleeding  - continue iron  HTN - may resume home medical regimen  DVT - Coumadin per pharmacy   Code Status: Full Family Communication: Pt at bedside Disposition Plan: Home vs SNF in 1-2 days    IV Access:   Peripheral IV Procedures and diagnostic studies:   Dg Chest 1 View   12/23/2014   No active cardiopulmonary process. 2. Aortic atherosclerosis.    Dg Pelvis 1-2 Views  12/23/2014  Minimally displaced intertrochanteric fracture right hip.  OA of the hips right worse than left.  Dg Femur Right  10/24/8526   No complicating feature status post ORIF for a right hip intertrochanteric fracture.   Dg Femur Right  12/23/2014   Osseous demineralization with osteoarthritic changes RIGHT hip.  Probable nondisplaced intertrochanteric fracture RIGHT femur.   Pelvis  Portable  12/23/2014 Interval internal fixation of right femoral intertrochanteric fracture in grossly anatomic alignment  Dg C-arm 1-60 Min-no Report  78/24/2353  No complicating feature status post ORIF for a right hip intertrochanteric fracture.   Medical Consultants:   Ortho  Other Consultants:   Physical therapy  Anti-Infectives:   None   Faye Ramsay, MD  [facility] Pager [phone]  If 7PM-7AM, please contact night-coverage [email or web address] Password [facility] 12/24/2014, 2:39 PM   LOS: 1 day   HPI/Subjective: No events overnight.   Objective: Filed Vitals:   12/24/14 0356 12/24/14 0602 12/24/14 1020 12/24/14 1419  BP:  107/51 118/39 117/43  Pulse:  76 62 64  Temp:  98.1 F (36.7 C) 97.9 F (36.6 C) 98.7 F (37.1 C)  TempSrc:  Oral Oral Oral  Resp: 18 17 17 17   Height:      Weight:      SpO2: 100% 99% 100% 100%    Intake/Output Summary (Last 24 hours) at 12/24/14 1439 Last data filed at 12/24/14 1419  Gross per 24 hour  Intake   1935 ml  Output   1425 ml  Net    510 ml    Exam:   General:  Pt is alert, follows commands appropriately, not in acute distress  Cardiovascular: Regular rate and rhythm, no rubs, no gallops  Respiratory: Clear to auscultation bilaterally, no wheezing, no crackles, no rhonchi  Abdomen: Soft, non tender, non  distended, bowel sounds present, no guarding  Extremities: No edema, pulses DP and PT palpable bilaterally  Data Reviewed: Basic Metabolic Panel:  Recent Labs Lab 12/23/14 1530 12/24/14 0515  NA 139 135  K 3.9 3.7  CL 107 105  CO2 26 24  GLUCOSE 122* 123*  BUN 36* 27*  CREATININE 1.03 0.88  CALCIUM 9.4 8.1*   Liver Function Tests:  Recent Labs Lab 12/23/14 1530  AST 28  ALT 13  ALKPHOS 43  BILITOT 0.7  PROT 6.7  ALBUMIN 3.9   CBC:  Recent Labs Lab 12/23/14 1530 12/24/14 0515  WBC 11.9* 8.0  NEUTROABS 10.1*  --   HGB 10.9* 8.8*  HCT 34.5* 25.7*  MCV 98.6 96.3  PLT 245 180    Scheduled  Meds: . ferrous sulfate  325 mg Oral TID PC  . warfarin  2.5 mg Oral ONCE-1800  . Warfarin - Pharmacist Dosing Inpatient   Does not apply q1800   Continuous Infusions: . lactated ringers

## 2014-12-24 NOTE — Progress Notes (Signed)
Subjective: 1 Day Post-Op Procedure(s) (LRB): INTRAMEDULLARY (IM) NAIL FEMORAL (Right) Patient reports pain as mild.  Up with PT now.  No n/v/f/c.  TOlerating regular diet.  Objective: Vital signs in last 24 hours: Temp:  [97.6 F (36.4 C)-98.1 F (36.7 C)] 98.1 F (36.7 C) (12/25 0602) Pulse Rate:  [55-76] 76 (12/25 0602) Resp:  [11-18] 17 (12/25 0602) BP: (107-178)/(47-84) 107/51 mmHg (12/25 0602) SpO2:  [98 %-100 %] 99 % (12/25 0602) Weight:  [55.339 kg (122 lb)-55.8 kg (123 lb 0.3 oz)] 55.339 kg (122 lb) (12/24 2148)  Intake/Output from previous day: 12/24 0701 - 12/25 0700 In: 1695 [P.O.:120; I.V.:1575] Out: 1100 [Urine:1000; Blood:100] Intake/Output this shift: Total I/O In: 240 [P.O.:240] Out: -    Recent Labs  12/23/14 1530 12/24/14 0515  HGB 10.9* 8.8*    Recent Labs  12/23/14 1530 12/24/14 0515  WBC 11.9* 8.0  RBC 3.50* 2.67*  HCT 34.5* 25.7*  PLT 245 180    Recent Labs  12/23/14 1530 12/24/14 0515  NA 139 135  K 3.9 3.7  CL 107 105  CO2 26 24  BUN 36* 27*  CREATININE 1.03 0.88  GLUCOSE 122* 123*  CALCIUM 9.4 8.1*    Recent Labs  12/23/14 1530 12/24/14 0515  INR 1.91* 1.87*    PE:  elderly woman in nad.  R hip dressed and dry.  NVI at  RLE  Assessment/Plan: 1 Day Post-Op Procedure(s) (LRB): INTRAMEDULLARY (IM) NAIL FEMORAL (Right) D/c foley.  Regular diet.  WBAT.  Observe for signs of acute blood loss anemia.  Continue PT.  Saline lock IV.  Wylene Simmer 12/24/2014, 9:56 AM

## 2014-12-25 LAB — CBC
HCT: 23.9 % — ABNORMAL LOW (ref 36.0–46.0)
Hemoglobin: 7.8 g/dL — ABNORMAL LOW (ref 12.0–15.0)
MCH: 31.3 pg (ref 26.0–34.0)
MCHC: 32.6 g/dL (ref 30.0–36.0)
MCV: 96 fL (ref 78.0–100.0)
Platelets: 168 10*3/uL (ref 150–400)
RBC: 2.49 MIL/uL — AB (ref 3.87–5.11)
RDW: 13.2 % (ref 11.5–15.5)
WBC: 7.8 10*3/uL (ref 4.0–10.5)

## 2014-12-25 LAB — URINE CULTURE
COLONY COUNT: NO GROWTH
CULTURE: NO GROWTH

## 2014-12-25 LAB — BASIC METABOLIC PANEL
ANION GAP: 4 — AB (ref 5–15)
BUN: 27 mg/dL — ABNORMAL HIGH (ref 6–23)
CHLORIDE: 103 meq/L (ref 96–112)
CO2: 23 mmol/L (ref 19–32)
CREATININE: 1.22 mg/dL — AB (ref 0.50–1.10)
Calcium: 7.8 mg/dL — ABNORMAL LOW (ref 8.4–10.5)
GFR calc non Af Amer: 39 mL/min — ABNORMAL LOW (ref >90)
GFR, EST AFRICAN AMERICAN: 45 mL/min — AB (ref >90)
Glucose, Bld: 122 mg/dL — ABNORMAL HIGH (ref 70–99)
POTASSIUM: 3.3 mmol/L — AB (ref 3.5–5.1)
Sodium: 130 mmol/L — ABNORMAL LOW (ref 135–145)

## 2014-12-25 LAB — PROTIME-INR
INR: 2.09 — AB (ref 0.00–1.49)
Prothrombin Time: 23.7 seconds — ABNORMAL HIGH (ref 11.6–15.2)

## 2014-12-25 LAB — PREPARE RBC (CROSSMATCH)

## 2014-12-25 LAB — HEMOGLOBIN AND HEMATOCRIT, BLOOD
HCT: 30.2 % — ABNORMAL LOW (ref 36.0–46.0)
Hemoglobin: 9.5 g/dL — ABNORMAL LOW (ref 12.0–15.0)

## 2014-12-25 MED ORDER — KCL IN DEXTROSE-NACL 20-5-0.9 MEQ/L-%-% IV SOLN
INTRAVENOUS | Status: DC
Start: 2014-12-25 — End: 2014-12-26
  Administered 2014-12-25 (×2): via INTRAVENOUS
  Filled 2014-12-25 (×3): qty 1000

## 2014-12-25 MED ORDER — SODIUM CHLORIDE 0.9 % IV SOLN
Freq: Once | INTRAVENOUS | Status: AC
  Administered 2014-12-25: 09:00:00 via INTRAVENOUS

## 2014-12-25 MED ORDER — POTASSIUM CHLORIDE CRYS ER 20 MEQ PO TBCR
40.0000 meq | EXTENDED_RELEASE_TABLET | Freq: Once | ORAL | Status: AC
  Administered 2014-12-25: 40 meq via ORAL
  Filled 2014-12-25: qty 2

## 2014-12-25 MED ORDER — WARFARIN SODIUM 2.5 MG PO TABS
2.5000 mg | ORAL_TABLET | Freq: Once | ORAL | Status: AC
  Administered 2014-12-25: 2.5 mg via ORAL
  Filled 2014-12-25: qty 1

## 2014-12-25 MED ORDER — FUROSEMIDE 10 MG/ML IJ SOLN
20.0000 mg | Freq: Once | INTRAMUSCULAR | Status: AC
  Administered 2014-12-25: 20 mg via INTRAVENOUS
  Filled 2014-12-25: qty 2

## 2014-12-25 NOTE — Progress Notes (Signed)
Nutrition Brief Note  RD consulted for nutritional assessment.  Pt reports good appetite and PO intake of 100%. Family in room during visit, states that she was eating well PTA.  Pt states her weight has been stable over the past year.   Encouraged pt to eat her meals and encouraged consumption of protein foods to aid in healing and strength.  Wt Readings from Last 15 Encounters:  12/23/14 122 lb (55.339 kg)  11/23/14 123 lb (55.792 kg)  05/10/13 136 lb 3.9 oz (61.8 kg)    Body mass index is 20.93 kg/(m^2). Patient meets criteria for normal range based on current BMI.   Current diet order is regular, patient is consuming approximately 100% of meals at this time. Labs and medications reviewed.   No nutrition interventions warranted at this time. If nutrition issues arise, please re-consult RD.   Clayton Bibles, MS, RD, LDN Pager: [phone] After Hours Pager: [phone]

## 2014-12-25 NOTE — Progress Notes (Signed)
[place] for Warfarin Indication: Hx of DVT; s/p hip fracture repair 12/23/14  Allergies  Allergen Reactions  . Chocolate Rash    If I eat too much    Patient Measurements: Height: 5\' 4"  (162.6 cm) Weight: 122 lb (55.339 kg) IBW/kg (Calculated) : 54.7   Vital Signs: Temp: 99.9 F (37.7 C) (12/26 1230) Temp Source: Oral (12/26 0910) BP: 110/37 mmHg (12/26 1230) Pulse Rate: 69 (12/26 1230)  Labs:  Recent Labs  12/23/14 1530 12/24/14 0515 12/25/14 0527 12/25/14 1441  HGB 10.9* 8.8* 7.8* 9.5*  HCT 34.5* 25.7* 23.9* 30.2*  PLT 245 180 168  --   LABPROT 22.1* 21.7* 23.7*  --   INR 1.91* 1.87* 2.09*  --   CREATININE 1.03 0.88 1.22*  --     Estimated Creatinine Clearance: 28.6 mL/min (by C-G formula based on Cr of 1.22).   Medical History: Past Medical History  Diagnosis Date  . Hypertension   . Diabetes mellitus   . Osteoarthritis     r knee  . GERD (gastroesophageal reflux disease)   . Osteopenia   . Hearing loss   . Peripheral neuropathy   . Carotid stenosis     right  . Dermoid tumor     arm  . DVT (deep venous thrombosis) 04/30/2013  . Clotting disorder     Medications:  Scheduled:  . fenofibrate  160 mg Oral Daily  . ferrous sulfate  325 mg Oral TID PC  . gabapentin  200 mg Oral TID  . propranolol  40 mg Oral Daily  . Warfarin - Pharmacist Dosing Inpatient   Does not apply q1800    Assessment: 80 y.o. female hx of HTN, DM, GERD, DVT, on warfarin, presented to [facility] ED after an episode of fall at home.  Ortho took to OR for an intramedullary nail fixation 12/24. Pre-op INR was 1.91. No vitamin K was administered. Pharmacy was consulted to dose warfarin while inpatient.  Home warfarin dose: 2.5mg  six days a week and 5mg  one day a week. LD 12/23 (5mg ).  Also noted to be on fenofibrate at home, which can elevate INR and bleeding risk.  Goal of Therapy:  INR 2-3  Today, 12/25:  Hgb low continues to drop  post-operatively.  Pltc remain WNL.  Transfused 1 unit PRBC  No reports of overt bleeding in chart notes.  Hip with some edema but no hematoma per nursing  INR now therapeutic after warfarin resumed x 1day.  Suspect this is likely due to prior reduced PO intake surrounding fall and surgery  Eating 50-100% of meals  Plan:   Surgery OK to continue warfarin despite post-op Hgb decreases as this is expected with this type of surgery.  Confirmed with Attending, and will continue warfarin with 2.5 mg PO x 1 tonight at 6PM (per PTA regimen).  Follow PT/INR daily while inpatient.  If INR rises rapidly, may need to dose lower than PTA regimen  Needs close INR monitoring following discharge (suggest first check no later than two days post-discharge).  Reuel Boom, PharmD Pager: [phone] 12/25/2014, 4:53 PM

## 2014-12-25 NOTE — Progress Notes (Signed)
OT Cancellation Note  Patient Details Name: Kelly Mercer MRN: [id number] DOB: 09/28/1926   Cancelled Treatment:    Reason Eval/Treat Not Completed: Other (comment).  Noted orthopedics wants therapy to hold until completion of transfusion.   Will return later today or tomorrow.  [name] 12/25/2014, 8:48 AM  Lesle Chris, OTR/L [phone] 12/25/2014

## 2014-12-25 NOTE — Progress Notes (Signed)
Patient ID: Kelly Mercer, female   DOB: 06-30-1926, 80 y.o.   MRN: [id number]  [facility] PROGRESS NOTE  BEXLEE BERGDOLL VZC:[id number] DOB: 1926/11/29 DOA: 12/23/2014 PCP: Jerlyn Ly, MD   Brief narrative: Pt is 80 y.o. female hx of HTN, DM, GERD, DVT on coumadin, presented to [facility] ED after an episode of fall at home, ? Syncope but no LOC. Pt denies any prodromal events, no similar events in the past. She denies chest pain or shortness of breath, no dizziness, no abd or urinary concerns. In ED, XRAY notable for R hip fracture and TRH asked to admit to medical floor for further evaluation and management of hip fracture. Ortho team consulted and to take to OR tonight.   Assessment and Plan: Active Problems: Right hip fracture - s/p IM nailing, right femur, post op day #2 - up with PT once transfusion completed , analgesia as needed - plan for d/c SNF likely on Monday Leukocytosis - no clear sign of an infectious etiology - CXR with no signs of an infectious etiology  - WBC WNL this AM Acute renal failure - secondary to prerenal etiology - hold Losartan and HCTZ until BP and renal function stabilizes Hypokalemia - mild, will supplement and repeat BMP in AM Anemia of chronic iron deficiency  - drop in Hg likely post op in the setting of Coumadin use - transfusing one unit PRBC 12/26, repeat CBC in AM - continue iron  HTN - soft this AM - hold Losartan and HCTZ until BP stabilizes DVT - Coumadin but on hold today due to drop in Hg  Code Status: Full Family Communication: Pt and son at bedside Disposition Plan: SNF Monday   IV Access:    Peripheral IV Procedures and diagnostic studies:    Dg Chest 1 View 12/23/2014 No active cardiopulmonary process. 2. Aortic atherosclerosis.   Dg Pelvis 1-2 Views 12/23/2014 Minimally displaced intertrochanteric fracture right hip. OA of the hips right worse than left.   Dg Femur Right 12/87/8676 No  complicating feature status post ORIF for a right hip intertrochanteric fracture.   Dg Femur Right 12/23/2014 Osseous demineralization with osteoarthritic changes RIGHT hip. Probable nondisplaced intertrochanteric fracture RIGHT femur.   Pelvis Portable 12/23/2014 Interval internal fixation of right femoral intertrochanteric fracture in grossly anatomic alignment  Dg C-arm 1-60 Min-no Report 72/08/4708 No complicating feature status post ORIF for a right hip intertrochanteric fracture.  Medical Consultants:    Ortho  Other Consultants:    Physical therapy  Anti-Infectives:    None  Faye Ramsay, MD   Faye Ramsay, MD  [facility] Pager [phone]  If 7PM-7AM, please contact night-coverage [email or web address] Password [email or web address] 12/25/2014, 11:06 AM   LOS: 2 days   HPI/Subjective: No events overnight.   Objective: Filed Vitals:   12/25/14 0909 12/25/14 0910 12/25/14 0945 12/25/14 1045  BP: 91/30 102/32 96/28 100/28  Pulse:  75 72 72  Temp:  99.4 F (37.4 C) 98.9 F (37.2 C) 98.7 F (37.1 C)  TempSrc:  Oral    Resp:  16 16 16   Height:      Weight:      SpO2:  98% 98%     Intake/Output Summary (Last 24 hours) at 12/25/14 1106 Last data filed at 12/25/14 0945  Gross per 24 hour  Intake    755 ml  Output    575 ml  Net    180 ml    Exam:   General:  Pt is alert, follows commands appropriately,  not in acute distress  Cardiovascular: Regular rate and rhythm, no rubs, no gallops  Respiratory: Clear to auscultation bilaterally, no wheezing, no crackles, no rhonchi  Abdomen: Soft, non tender, non distended, bowel sounds present, no guarding  Data Reviewed: Basic Metabolic Panel:  Recent Labs Lab 12/23/14 1530 12/24/14 0515 12/25/14 0527  NA 139 135 130*  K 3.9 3.7 3.3*  CL 107 105 103  CO2 26 24 23   GLUCOSE 122* 123* 122*  BUN 36* 27* 27*  CREATININE 1.03 0.88 1.22*  CALCIUM 9.4 8.1* 7.8*   Liver Function  Tests:  Recent Labs Lab 12/23/14 1530  AST 28  ALT 13  ALKPHOS 43  BILITOT 0.7  PROT 6.7  ALBUMIN 3.9   CBC:  Recent Labs Lab 12/23/14 1530 12/24/14 0515 12/25/14 0527  WBC 11.9* 8.0 7.8  NEUTROABS 10.1*  --   --   HGB 10.9* 8.8* 7.8*  HCT 34.5* 25.7* 23.9*  MCV 98.6 96.3 96.0  PLT 245 180 168    Recent Results (from the past 240 hour(s))  Urine culture     Status: None   Collection Time: 12/23/14  9:43 PM  Result Value Ref Range Status   Specimen Description URINE, CATHETERIZED  Final   Special Requests NONE  Final   Colony Count NO GROWTH Performed at [facility]   Final   Culture NO GROWTH Performed at [facility]   Final   Report Status 12/25/2014 FINAL  Final     Scheduled Meds: . fenofibrate  160 mg Oral Daily  . ferrous sulfate  325 mg Oral TID PC  . furosemide  20 mg Intravenous Once  . gabapentin  200 mg Oral TID  . propranolol  40 mg Oral Daily  . hydrochlorothiazide  12.5 mg Oral Daily  . losartan  50 mg Oral Daily  . Warfarin -    Does not apply q1800   Continuous Infusions: . dextrose 5 % and 0.9 % NaCl with KCl 20 mEq/L 75 mL/hr at 12/25/14 [phone]

## 2014-12-25 NOTE — Progress Notes (Addendum)
   Subjective: 2 Days Post-Op Procedure(s) (LRB): INTRAMEDULLARY (IM) NAIL FEMORAL (Right) Patient reports pain as mild.   Patient is well, and has had no acute complaints or problems other than some pain in her right hip with WB. She denies SOB and chest pain. No issues overnight. Patient denies dizziness and lightheadedness.  Plan is to go Home vs SNF after hospital stay.  Objective: Vital signs in last 24 hours: Temp:  [97.9 F (36.6 C)-98.9 F (37.2 C)] 98.5 F (36.9 C) (12/26 0537) Pulse Rate:  [58-73] 73 (12/26 0537) Resp:  [15-18] 16 (12/26 0537) BP: (117-134)/(38-44) 120/38 mmHg (12/26 0537) SpO2:  [98 %-100 %] 98 % (12/26 0537)  Intake/Output from previous day:  Intake/Output Summary (Last 24 hours) at 12/25/14 0737 Last data filed at 12/24/14 1951  Gross per 24 hour  Intake    360 ml  Output    625 ml  Net   -265 ml     Labs:  Recent Labs  12/23/14 1530 12/24/14 0515 12/25/14 0527  HGB 10.9* 8.8* 7.8*    Recent Labs  12/24/14 0515 12/25/14 0527  WBC 8.0 7.8  RBC 2.67* 2.49*  HCT 25.7* 23.9*  PLT 180 168    Recent Labs  12/24/14 0515 12/25/14 0527  NA 135 130*  K 3.7 3.3*  CL 105 103  CO2 24 23  BUN 27* 27*  CREATININE 0.88 1.22*  GLUCOSE 123* 122*  CALCIUM 8.1* 7.8*    Recent Labs  12/24/14 0515 12/25/14 0527  INR 1.87* 2.09*    EXAM General - Patient is Alert and Oriented Extremity - Neurologically intact Intact pulses distally Dorsiflexion/Plantar flexion intact Compartment soft Dressing/Incision - clean, dry, no drainage Motor Function - intact, moving foot and toes well on exam.   Past Medical History  Diagnosis Date  . Hypertension   . Diabetes mellitus   . Osteoarthritis     r knee  . GERD (gastroesophageal reflux disease)   . Osteopenia   . Hearing loss   . Peripheral neuropathy   . Carotid stenosis     right  . Dermoid tumor     arm  . DVT (deep venous thrombosis) 04/30/2013  . Clotting disorder      Assessment/Plan: 2 Days Post-Op Procedure(s) (LRB): INTRAMEDULLARY (IM) NAIL FEMORAL (Right) Active Problems:   Hip fracture  Estimated body mass index is 20.93 kg/(m^2) as calculated from the following:   Height as of this encounter: 5\' 4"  (1.626 m).   Weight as of this encounter: 55.339 kg (122 lb). Advance diet Up with therapy when transfusion completed.   DVT Prophylaxis - Coumadin WBAT right LE  Patient is fair. Hgb dropped to 7.8 with last BP 120/38. Patient unsure of home vs SNF placement. Will see how she does with therapy. Will transfuse one unit today. Change fluids to D5 with KCl due to K depletion. Hold PT until completion of transfusion.   Ardeen Jourdain, PA-C Orthopaedic Surgery 12/25/2014, 7:37 AM

## 2014-12-25 NOTE — Progress Notes (Signed)
Occupational Therapy Treatment Patient Details Name: Kelly Mercer MRN: [id number] DOB: 1926/08/29 Today's Date: 12/25/2014    History of present illness 80 y.o. female hx of HTN, DM, GERD, DVT on coumadin, presented to [facility] ED after an episode of fall at home, ? Syncope but no LOC. R hip fx s/p ORIF with IM nail.   OT comments  Pt fatiques easily and had posterior LOB with transfer back to bed.  She and family want to pursue STSNF prior to home.  Follow Up Recommendations  SNF    Equipment Recommendations  3 in 1 bedside comode    Recommendations for Other Services      Precautions / Restrictions Precautions Precautions: Fall Restrictions Weight Bearing Restrictions: No Other Position/Activity Restrictions: WBAT       Mobility Bed Mobility              Transfers Overall transfer level: Needs assistance Equipment used: Rolling walker (2 wheeled) Transfers: Sit to/from Stand Sit to Stand: Mod assist         General transfer comment: cues for UE/LE placement    Balance                                   ADL               Lower Body Bathing: Sit to/from stand;With adaptive equipment;Moderate assistance (60% task; mod A sit to stand)       Lower Body Dressing: Moderate assistance;Sit to/from stand (mesh underwear only:  could not manage with reacher)   Toilet Transfer: Moderate assistance;Stand-pivot;BSC   Toileting- Clothing Manipulation and Hygiene: Set up;Sitting/lateral lean         General ADL Comments: performed adl from commode.  Educated on reacher:  she has one with suction cups at home.  She could not use reacher to manage mesh underwear with this.  Pt had posterior LOB when performing SPT to chair requiring assistance to correct      Vision                     Perception     Praxis      Cognition   Behavior During Therapy: [facility] for tasks assessed/performed Overall Cognitive Status: Within Functional  Limits for tasks assessed                       Extremity/Trunk Assessment               Exercises    Shoulder Instructions       General Comments      Pertinent Vitals/ Pain       Pain Assessment: 0-10 Pain Score: 4  Pain Location: R thigh Pain Descriptors / Indicators: Aching Pain Intervention(s): Limited activity within patient's tolerance;Monitored during session;Premedicated before session;Repositioned  Home Living                                          Prior Functioning/Environment              Frequency Min 2X/week     Progress Toward Goals  OT Goals(current goals can now be found in the care plan section)  Progress towards OT goals: Progressing toward goals  Acute Rehab OT Goals Patient Stated Goal: walk  Plan  Discharge plan needs to be updated    Co-evaluation                 End of Session     Activity Tolerance Patient tolerated treatment well   Patient Left in chair;with call bell/phone within reach;with family/visitor present   Nurse Communication          Time: 7670-1100 OT Time Calculation (min): 24 min  Charges: OT General Charges $OT Visit: 1 Procedure OT Treatments $Self Care/Home Management : 23-37 mins  Kilyn Maragh 12/25/2014, 3:21 PM  Lesle Chris, OTR/L [phone] 12/25/2014

## 2014-12-25 NOTE — Progress Notes (Signed)
Physical Therapy Treatment Patient Details Name: Kelly Mercer MRN: [id number] DOB: 1926/10/24 Today's Date: 12/25/2014    History of Present Illness 80 y.o. female hx of HTN, DM, GERD, DVT on coumadin, presented to [facility] ED after an episode of fall at home, ? Syncope but no LOC. R hip fx s/p ORIF with IM nail.    PT Comments    Pt progressing with mobility.  Pt states has discussed with family and they have decided follow up rehab at SNF level is in best interest of pt.  Agreed with same.  Follow Up Recommendations  SNF     Equipment Recommendations  None recommended by PT    Recommendations for Other Services OT consult     Precautions / Restrictions Precautions Precautions: Fall Restrictions Weight Bearing Restrictions: No Other Position/Activity Restrictions: WBAT    Mobility  Bed Mobility Overal bed mobility: Needs Assistance Bed Mobility: Supine to Sit     Supine to sit: Min assist;Mod assist     General bed mobility comments: Cues for sequence and use of L LE to self assist.  Physical assist to manage R LE and to bring trunk to upright position  Transfers Overall transfer level: Needs assistance Equipment used: Rolling walker (2 wheeled) Transfers: Sit to/from Stand Sit to Stand: Mod assist         General transfer comment: cues for transition position and use of UEs to self assist  Ambulation/Gait Ambulation/Gait assistance: Min assist Ambulation Distance (Feet): 38 Feet Assistive device: Rolling walker (2 wheeled) Gait Pattern/deviations: Step-to pattern;Decreased step length - right;Decreased step length - left;Shuffle;Narrow base of support;Trunk flexed Gait velocity: decr   General Gait Details: cues for posture, sequence and position from [employer]            Wheelchair Mobility    Modified Rankin (Stroke Patients Only)       Balance                                    Cognition Arousal/Alertness:  Awake/alert Behavior During Therapy: WFL for tasks assessed/performed Overall Cognitive Status: Within Functional Limits for tasks assessed                      Exercises General Exercises - Lower Extremity Ankle Circles/Pumps: AROM;Both;15 reps;Supine Quad Sets: AROM;Both;10 reps;Supine Gluteal Sets: AROM;Both;10 reps;Supine Heel Slides: AAROM;Right;Supine;20 reps Hip ABduction/ADduction: AAROM;Right;Supine;15 reps    General Comments        Pertinent Vitals/Pain Pain Assessment: 0-10 Pain Score: 4  Pain Location: R thigh Pain Descriptors / Indicators: Aching;Sore Pain Intervention(s): Limited activity within patient's tolerance;Monitored during session;Premedicated before session    Home Living                      Prior Function            PT Goals (current goals can now be found in the care plan section) Acute Rehab PT Goals Patient Stated Goal: walk PT Goal Formulation: With patient Time For Goal Achievement: 01/07/15 Potential to Achieve Goals: Good Progress towards PT goals: Progressing toward goals    Frequency  Min 6X/week    PT Plan Discharge plan needs to be updated    Co-evaluation             End of Session Equipment Utilized During Treatment: Gait belt Activity Tolerance: Patient tolerated treatment well  Patient left: in chair;with call bell/phone within reach     Time: 1405-1436 PT Time Calculation (min) (ACUTE ONLY): 31 min  Charges:  $Gait Training: 8-22 mins $Therapeutic Exercise: 8-22 mins                    G Codes:      Leonce Bale Jan 14, 2015, 2:53 PM

## 2014-12-26 LAB — CBC
HEMATOCRIT: 26.8 % — AB (ref 36.0–46.0)
Hemoglobin: 8.8 g/dL — ABNORMAL LOW (ref 12.0–15.0)
MCH: 30.8 pg (ref 26.0–34.0)
MCHC: 32.8 g/dL (ref 30.0–36.0)
MCV: 93.7 fL (ref 78.0–100.0)
Platelets: 159 10*3/uL (ref 150–400)
RBC: 2.86 MIL/uL — AB (ref 3.87–5.11)
RDW: 14.3 % (ref 11.5–15.5)
WBC: 8.1 10*3/uL (ref 4.0–10.5)

## 2014-12-26 LAB — BASIC METABOLIC PANEL
ANION GAP: 3 — AB (ref 5–15)
BUN: 34 mg/dL — AB (ref 6–23)
CO2: 23 mmol/L (ref 19–32)
CREATININE: 1.03 mg/dL (ref 0.50–1.10)
Calcium: 7.7 mg/dL — ABNORMAL LOW (ref 8.4–10.5)
Chloride: 110 mEq/L (ref 96–112)
GFR calc Af Amer: 55 mL/min — ABNORMAL LOW (ref >90)
GFR, EST NON AFRICAN AMERICAN: 48 mL/min — AB (ref >90)
Glucose, Bld: 154 mg/dL — ABNORMAL HIGH (ref 70–99)
Potassium: 4.2 mmol/L (ref 3.5–5.1)
Sodium: 136 mmol/L (ref 135–145)

## 2014-12-26 LAB — TYPE AND SCREEN
ABO/RH(D): O POS
Antibody Screen: NEGATIVE
UNIT DIVISION: 0

## 2014-12-26 LAB — PROTIME-INR
INR: 1.58 — ABNORMAL HIGH (ref 0.00–1.49)
PROTHROMBIN TIME: 19 s — AB (ref 11.6–15.2)

## 2014-12-26 MED ORDER — DOCUSATE SODIUM 100 MG PO CAPS
100.0000 mg | ORAL_CAPSULE | Freq: Two times a day (BID) | ORAL | Status: DC
  Administered 2014-12-26 – 2014-12-27 (×3): 100 mg via ORAL
  Filled 2014-12-26 (×4): qty 1

## 2014-12-26 MED ORDER — WARFARIN SODIUM 5 MG PO TABS
5.0000 mg | ORAL_TABLET | Freq: Once | ORAL | Status: AC
  Administered 2014-12-26: 5 mg via ORAL
  Filled 2014-12-26: qty 1

## 2014-12-26 MED ORDER — POLYETHYLENE GLYCOL 3350 17 G PO PACK
17.0000 g | PACK | Freq: Every day | ORAL | Status: DC | PRN
Start: 2014-12-26 — End: 2014-12-27
  Filled 2014-12-26: qty 1

## 2014-12-26 NOTE — Progress Notes (Signed)
Physical Therapy Treatment Patient Details Name: Kelly Mercer MRN: [id number] DOB: 26-Dec-1926 Today's Date: 12/26/2014    History of Present Illness 80 y.o. female hx of HTN, DM, GERD, DVT on coumadin, presented to [facility] ED after an episode of fall at home, ? Syncope but no LOC. R hip fx s/p ORIF with IM nail.    PT Comments    Pt continues motivated and making steady progress   Follow Up Recommendations  SNF     Equipment Recommendations  None recommended by PT    Recommendations for Other Services OT consult     Precautions / Restrictions Precautions Precautions: Fall Restrictions Weight Bearing Restrictions: No Other Position/Activity Restrictions: WBAT    Mobility  Bed Mobility Overal bed mobility: Needs Assistance Bed Mobility: Supine to Sit     Supine to sit: Min assist     General bed mobility comments: Cues for sequence and use of L LE to self assist.  Physical assist to manage R LE and to bring trunk to upright position  Transfers Overall transfer level: Needs assistance Equipment used: Rolling walker (2 wheeled) Transfers: Sit to/from Stand Sit to Stand: Min assist;Mod assist         General transfer comment: cues for UE/LE placement  Ambulation/Gait Ambulation/Gait assistance: Min assist Ambulation Distance (Feet): 75 Feet Assistive device: Rolling walker (2 wheeled) Gait Pattern/deviations: Step-to pattern;Step-through pattern;Decreased step length - right;Decreased step length - left;Shuffle;Trunk flexed;Narrow base of support Gait velocity: decr   General Gait Details: cues for posture, BOS, sequence and position from [employer]            Wheelchair Mobility    Modified Rankin (Stroke Patients Only)       Balance                                    Cognition Arousal/Alertness: Awake/alert Behavior During Therapy: WFL for tasks assessed/performed Overall Cognitive Status: Within Functional Limits for tasks  assessed                      Exercises General Exercises - Lower Extremity Ankle Circles/Pumps: AROM;Both;15 reps;Supine Quad Sets: AROM;Both;10 reps;Supine Gluteal Sets: AROM;Both;10 reps;Supine Heel Slides: AAROM;Right;Supine;20 reps Hip ABduction/ADduction: AAROM;Right;Supine;15 reps    General Comments        Pertinent Vitals/Pain Pain Assessment: 0-10 Pain Score: 2  Pain Location: R hip and thigh Pain Descriptors / Indicators: Aching Pain Intervention(s): Limited activity within patient's tolerance;Monitored during session;Premedicated before session    Home Living                      Prior Function            PT Goals (current goals can now be found in the care plan section) Acute Rehab PT Goals Patient Stated Goal: walk PT Goal Formulation: With patient Time For Goal Achievement: 01/07/15 Potential to Achieve Goals: Good Progress towards PT goals: Progressing toward goals    Frequency  Min 6X/week    PT Plan Discharge plan needs to be updated    Co-evaluation             End of Session Equipment Utilized During Treatment: Gait belt Activity Tolerance: Patient tolerated treatment well Patient left: in chair;with call bell/phone within reach     Time: 0925-0955 PT Time Calculation (min) (ACUTE ONLY): 30 min  Charges:  $Gait  Training: 8-22 mins $Therapeutic Exercise: 8-22 mins                    G Codes:      Albeiro Trompeter 01-14-2015, 12:06 PM

## 2014-12-26 NOTE — Progress Notes (Signed)
Patient ID: Kelly Mercer, female   DOB: 07/13/1926, 80 y.o.   MRN: [id number]  [facility] PROGRESS NOTE  Kelly Mercer ZSM:[id number] DOB: 1926/05/23 DOA: 12/23/2014 PCP: Jerlyn Ly, MD  Brief narrative: Pt is 80 y.o. female hx of HTN, DM, GERD, DVT on coumadin, presented to [facility] ED after an episode of fall at home, ? Syncope but no LOC. Pt denies any prodromal events, no similar events in the past. She denies chest pain or shortness of breath, no dizziness, no abd or urinary concerns. In ED, XRAY notable for R hip fracture and TRH asked to admit to medical floor for further evaluation and management of hip fracture. Ortho team consulted and to take to OR tonight.   Assessment and Plan: Active Problems: Right hip fracture - s/p IM nailing, right femur, post op day #3 - in hallway ambulating this AM - plan for d/c SNF on Monday Leukocytosis - no clear sign of an infectious etiology - CXR with no signs of an infectious etiology  - WBC WNL this AM Acute renal failure - secondary to prerenal etiology - held Losartan and HCTZ  - Cr is now WNL Hypokalemia - supplemented and WNL this AM Anemia of chronic iron deficiency  - drop in Hg likely post op in the setting of Coumadin use - transfused one unit PRBC 12/26, repeat CBC in AM - continue iron  HTN - 121.60 - continue to hold Losartan and HCTZ for now nad possibly resume in AM DVT - Coumadin per pharmacy   Code Status: Full Family Communication: Pt and son at bedside Disposition Plan: SNF Monday   IV Access:    Peripheral IV Procedures and diagnostic studies:    Dg Chest 1 View 12/23/2014 No active cardiopulmonary process. 2. Aortic atherosclerosis.   Dg Pelvis 1-2 Views 12/23/2014 Minimally displaced intertrochanteric fracture right hip. OA of the hips right worse than left.   Dg Femur Right 49/20/1007 No complicating feature status post ORIF for a right hip intertrochanteric fracture.    Dg Femur Right 12/23/2014 Osseous demineralization with osteoarthritic changes RIGHT hip. Probable nondisplaced intertrochanteric fracture RIGHT femur.   Pelvis Portable 12/23/2014 Interval internal fixation of right femoral intertrochanteric fracture in grossly anatomic alignment  Dg C-arm 1-60 Min-no Report 12/19/7587 No complicating feature status post ORIF for a right hip intertrochanteric fracture.  Medical Consultants:    Ortho  Other Consultants:    Physical therapy  Anti-Infectives:    None  Faye Ramsay, MD  [facility] Pager [phone]  If 7PM-7AM, please contact night-coverage [email or web address] Password [facility] 12/26/2014, 12:57 PM   LOS: 3 days   HPI/Subjective: No events overnight.   Objective: Filed Vitals:   12/25/14 2119 12/26/14 0000 12/26/14 0400 12/26/14 0544  BP: 151/61   121/60  Pulse: 81   79  Temp: 98.3 F (36.8 C)   98 F (36.7 C)  TempSrc: Oral   Oral  Resp: 18 18 18 16   Height:      Weight:      SpO2: 100% 100% 99% 99%    Intake/Output Summary (Last 24 hours) at 12/26/14 1257 Last data filed at 12/26/14 0548  Gross per 24 hour  Intake 2258.75 ml  Output   1450 ml  Net 808.75 ml    Exam:   General:  Pt is alert, follows commands appropriately, not in acute distress  Cardiovascular: Regular rate and rhythm, no rubs, no gallops  Respiratory: Clear to auscultation bilaterally, no wheezing, no crackles, no rhonchi  Abdomen: Soft, non tender, non distended, bowel sounds present, no guarding  Extremities: No edema, pulses DP and PT palpable bilaterally  Data Reviewed: Basic Metabolic Panel:  Recent Labs Lab 12/23/14 1530 12/24/14 0515 12/25/14 0527 12/26/14 0411  NA 139 135 130* 136  K 3.9 3.7 3.3* 4.2  CL 107 105 103 110  CO2 26 24 23 23   GLUCOSE 122* 123* 122* 154*  BUN 36* 27* 27* 34*  CREATININE 1.03 0.88 1.22* 1.03  CALCIUM 9.4 8.1* 7.8* 7.7*   Liver Function Tests:  Recent Labs Lab  12/23/14 1530  AST 28  ALT 13  ALKPHOS 43  BILITOT 0.7  PROT 6.7  ALBUMIN 3.9   CBC:  Recent Labs Lab 12/23/14 1530 12/24/14 0515 12/25/14 0527 12/25/14 1441 12/26/14 0411  WBC 11.9* 8.0 7.8  --  8.1  NEUTROABS 10.1*  --   --   --   --   HGB 10.9* 8.8* 7.8* 9.5* 8.8*  HCT 34.5* 25.7* 23.9* 30.2* 26.8*  MCV 98.6 96.3 96.0  --  93.7  PLT 245 180 168  --  159     Recent Results (from the past 240 hour(s))  Urine culture     Status: None   Collection Time: 12/23/14  9:43 PM  Result Value Ref Range Status   Specimen Description URINE, CATHETERIZED  Final   Special Requests NONE  Final   Colony Count NO GROWTH Performed at [facility]   Final   Culture NO GROWTH Performed at [facility]   Final   Report Status 12/25/2014 FINAL  Final     Scheduled Meds: . docusate sodium  100 mg Oral BID  . fenofibrate  160 mg Oral Daily  . ferrous sulfate  325 mg Oral TID PC  . gabapentin  200 mg Oral TID  . propranolol  40 mg Oral Daily  . Warfarin - Pharmacist Dosing Inpatient   Does not apply q1800   Continuous Infusions: . dextrose 5 % and 0.9 % NaCl with KCl 20 mEq/L 75 mL/hr at 12/25/14 2349

## 2014-12-26 NOTE — Clinical Social Work Note (Signed)
RN called CSW to state that family wants [facility] as SNF choice  CSW did not have time this weekend to assess pt  CSW during the week will follow up  .Dede Query, LCSW [facility] Clinical Social Worker - Weekend Coverage cell #: [phone]

## 2014-12-26 NOTE — Progress Notes (Signed)
Kelly Mercer  MRN: [id number] DOB/Age: [birth year] 78 y.o. Physician: Rada Hay Procedure: Procedure(s) (LRB): INTRAMEDULLARY (IM) NAIL FEMORAL (Right)     Subjective: Hip sore when up on it. But feels better than yesterday. No BM yet but doesn't feel bloated but asking about stool softener  Vital Signs Temp:  [98 F (36.7 C)-99.9 F (37.7 C)] 98 F (36.7 C) (12/27 0544) Pulse Rate:  [69-81] 79 (12/27 0544) Resp:  [16-18] 16 (12/27 0544) BP: (91-151)/(24-61) 121/60 mmHg (12/27 0544) SpO2:  [98 %-100 %] 99 % (12/27 0544)  Lab Results  Recent Labs  12/25/14 0527 12/25/14 1441 12/26/14 0411  WBC 7.8  --  8.1  HGB 7.8* 9.5* 8.8*  HCT 23.9* 30.2* 26.8*  PLT 168  --  159   BMET  Recent Labs  12/25/14 0527 12/26/14 0411  NA 130* 136  K 3.3* 4.2  CL 103 110  CO2 23 23  GLUCOSE 122* 154*  BUN 27* 34*  CREATININE 1.22* 1.03  CALCIUM 7.8* 7.7*   INR  Date Value Ref Range Status  12/26/2014 1.58* 0.00 - 1.49 Final     Exam Hip dressings old scant drainage LE NVI        Plan Colace ordered Continue to mobilize Plan for skilled stay for rehabilitation before return to home  [facility] for Dr.Kevin Supple 12/26/2014, 9:03 AM

## 2014-12-26 NOTE — Progress Notes (Signed)
[place] for Warfarin Indication: Hx of DVT; s/p hip fracture repair 12/23/14  Allergies  Allergen Reactions  . Chocolate Rash    If I eat too much    Patient Measurements: Height: 5\' 4"  (162.6 cm) Weight: 122 lb (55.339 kg) IBW/kg (Calculated) : 54.7   Vital Signs: Temp: 98.6 F (37 C) (12/27 1348) Temp Source: Oral (12/27 1348) BP: 121/37 mmHg (12/27 1348) Pulse Rate: 64 (12/27 1348)  Labs:  Recent Labs  12/24/14 0515 12/25/14 0527 12/25/14 1441 12/26/14 0411  HGB 8.8* 7.8* 9.5* 8.8*  HCT 25.7* 23.9* 30.2* 26.8*  PLT 180 168  --  159  LABPROT 21.7* 23.7*  --  19.0*  INR 1.87* 2.09*  --  1.58*  CREATININE 0.88 1.22*  --  1.03    Estimated Creatinine Clearance: 33.9 mL/min (by C-G formula based on Cr of 1.03).   Medical History: Past Medical History  Diagnosis Date  . Hypertension   . Diabetes mellitus   . Osteoarthritis     r knee  . GERD (gastroesophageal reflux disease)   . Osteopenia   . Hearing loss   . Peripheral neuropathy   . Carotid stenosis     right  . Dermoid tumor     arm  . DVT (deep venous thrombosis) 04/30/2013  . Clotting disorder     Medications:  Scheduled:  . docusate sodium  100 mg Oral BID  . fenofibrate  160 mg Oral Daily  . ferrous sulfate  325 mg Oral TID PC  . gabapentin  200 mg Oral TID  . propranolol  40 mg Oral Daily  . Warfarin - Pharmacist Dosing Inpatient   Does not apply q1800    Assessment: 80 y.o. female hx of HTN, DM, GERD, DVT, on warfarin, presented to [facility] ED after an episode of fall at home.  Ortho took to OR for an intramedullary nail fixation 12/24. Pre-op INR was 1.91. No vitamin K was administered. Pharmacy was consulted to dose warfarin while inpatient.  Home warfarin dose: 2.5mg  six days a week and 5mg  one day a week (no set day). LD 12/23 (5mg ).  Also noted to be on fenofibrate at home, which can elevate INR and bleeding risk.  Goal of Therapy:  INR  2-3  Today, 12/25:  Hgb low continues to drop post-operatively; higher following PRBC x 1 yesterday, but appear to be declining again.  Plt lower but remain WNL.    No reports of overt bleeding in chart notes.  Hip with some edema but no hematoma per nursing  INR now SUBtherapeutic after initial rise to therapeutic levels.  Suspect some acute process related to illness may be making INR fluctuate, or appear to do so  Eating 50% of meals  Plan:   Warfarin 5 mg tonight at 1800.  Per Surgery these drops in Hgb are normal; will continue to dose unless significant drop in Hgb (>1 unit) or bleeding noted    Follow PT/INR daily while inpatient.  If INR rises rapidly, may need to dose lower than PTA regimen  Recommend daily CBC with postoperative drops in Hgb.  Needs close INR monitoring following discharge (suggest first check no later than two days post-discharge).  Reuel Boom, PharmD Pager: [phone] 12/26/2014, 4:43 PM

## 2014-12-27 ENCOUNTER — Encounter (HOSPITAL_COMMUNITY): Payer: Self-pay | Admitting: Orthopedic Surgery

## 2014-12-27 LAB — CBC
HCT: 28.2 % — ABNORMAL LOW (ref 36.0–46.0)
Hemoglobin: 9 g/dL — ABNORMAL LOW (ref 12.0–15.0)
MCH: 30.6 pg (ref 26.0–34.0)
MCHC: 31.9 g/dL (ref 30.0–36.0)
MCV: 95.9 fL (ref 78.0–100.0)
Platelets: 185 10*3/uL (ref 150–400)
RBC: 2.94 MIL/uL — ABNORMAL LOW (ref 3.87–5.11)
RDW: 14.2 % (ref 11.5–15.5)
WBC: 7.8 10*3/uL (ref 4.0–10.5)

## 2014-12-27 LAB — BASIC METABOLIC PANEL
ANION GAP: 2 — AB (ref 5–15)
BUN: 34 mg/dL — ABNORMAL HIGH (ref 6–23)
CALCIUM: 8 mg/dL — AB (ref 8.4–10.5)
CO2: 23 mmol/L (ref 19–32)
CREATININE: 0.89 mg/dL (ref 0.50–1.10)
Chloride: 112 mEq/L (ref 96–112)
GFR calc non Af Amer: 57 mL/min — ABNORMAL LOW (ref >90)
GFR, EST AFRICAN AMERICAN: 66 mL/min — AB (ref >90)
Glucose, Bld: 126 mg/dL — ABNORMAL HIGH (ref 70–99)
Potassium: 4.1 mmol/L (ref 3.5–5.1)
SODIUM: 137 mmol/L (ref 135–145)

## 2014-12-27 LAB — PROTIME-INR
INR: 1.52 — ABNORMAL HIGH (ref 0.00–1.49)
PROTHROMBIN TIME: 18.4 s — AB (ref 11.6–15.2)

## 2014-12-27 MED ORDER — FERROUS SULFATE 325 (65 FE) MG PO TABS: 325.0000 mg | ORAL_TABLET | Freq: Two times a day (BID) | ORAL | Status: AC

## 2014-12-27 MED ORDER — WARFARIN SODIUM 5 MG PO TABS: 5.0000 mg | ORAL_TABLET | Freq: Every day | ORAL | Status: AC

## 2014-12-27 MED ORDER — HYDROCODONE-ACETAMINOPHEN 5-325 MG PO TABS: 1.0000 | ORAL_TABLET | Freq: Four times a day (QID) | ORAL | Status: DC | PRN

## 2014-12-27 NOTE — Discharge Summary (Signed)
Physician Discharge Summary  Kelly Mercer XHB:[id number] DOB: Jan 09, 1926 DOA: 12/23/2014  PCP: Jerlyn Ly, MD  Admit date: 12/23/2014 Discharge date: 12/27/2014  Recommendations for Outpatient Follow-up:  1. Pt will need to follow up with PCP in 2-3 weeks post discharge 2. Please obtain BMP to evaluate electrolytes and kidney function 3. Please also check CBC to evaluate Hg and Hct levels 4. Pt advised to take Coumadin 5 mg tablet today 12/27/2014 and to have PT/INR checked in AM 12/28/2014 and have Coumadin level readjusted as indicated   Discharge Diagnoses:  Active Problems:   Hip fracture  Discharge Condition: Stable  Diet recommendation: Heart healthy diet discussed in details   PCP: Jerlyn Ly, MD  Brief narrative: Pt is 80 y.o. female hx of HTN, DM, GERD, DVT on coumadin, presented to [facility] ED after an episode of fall at home, ? Syncope but no LOC. Pt denies any prodromal events, no similar events in the past. She denies chest pain or shortness of breath, no dizziness, no abd or urinary concerns. In ED, XRAY notable for R hip fracture and TRH asked to admit to medical floor for further evaluation and management of hip fracture. Ortho team consulted and to take to OR tonight.   Assessment and Plan: Active Problems: Right hip fracture - s/p IM nailing, right femur, post op day #4 - in hallway ambulating this AM - plan for d/c SNF Leukocytosis - no clear sign of an infectious etiology - CXR with no signs of an infectious etiology  - WBC WNL this AM Acute renal failure - secondary to prerenal etiology - Cr is now WNL Hypokalemia - supplemented and WNL this AM Anemia of chronic iron deficiency  - drop in Hg likely post op in the setting of Coumadin use - transfused one unit PRBC 12/26, Hg stable  HTN - 121/60 - resume Losartan and HCTZ DVT - Coumadin   Code Status: Full Family Communication: Pt and son at bedside Disposition Plan: SNF    IV Access:     Peripheral IV Procedures and diagnostic studies:    Dg Chest 1 View 12/23/2014 No active cardiopulmonary process. 2. Aortic atherosclerosis.   Dg Pelvis 1-2 Views 12/23/2014 Minimally displaced intertrochanteric fracture right hip. OA of the hips right worse than left.   Dg Femur Right 81/12/7508 No complicating feature status post ORIF for a right hip intertrochanteric fracture.   Dg Femur Right 12/23/2014 Osseous demineralization with osteoarthritic changes RIGHT hip. Probable nondisplaced intertrochanteric fracture RIGHT femur.   Pelvis Portable 12/23/2014 Interval internal fixation of right femoral intertrochanteric fracture in grossly anatomic alignment  Dg C-arm 1-60 Min-no Report 25/85/2778 No complicating feature status post ORIF for a right hip intertrochanteric fracture.  Medical Consultants:    Ortho  Other Consultants:    Physical therapy  Anti-Infectives:    None    Discharge Exam: Filed Vitals:   12/27/14 0550  BP: 144/55  Pulse: 66  Temp: 98 F (36.7 C)  Resp: 18   Filed Vitals:   12/26/14 1348 12/26/14 2000 12/26/14 2038 12/27/14 0550  BP: 121/37  129/46 144/55  Pulse: 64  76 66  Temp: 98.6 F (37 C)  98.2 F (36.8 C) 98 F (36.7 C)  TempSrc: Oral  Oral Oral  Resp: 18 16 16 18   Height:      Weight:      SpO2: 99% 98% 98% 100%    General: Pt is alert, follows commands appropriately, not in acute distress Cardiovascular: Regular rate  and rhythm, S1/S2 +, no murmurs, no rubs, no gallops Respiratory: Clear to auscultation bilaterally, no wheezing, no crackles, no rhonchi Abdominal: Soft, non tender, non distended, bowel sounds +, no guarding  Discharge Instructions  Discharge Instructions    Diet - low sodium heart healthy    Complete by:  As directed      Increase activity slowly    Complete by:  As directed      Weight bearing as tolerated    Complete by:  As directed   Laterality:  right   Extremity:  Lower            Medication List    STOP taking these medications        acetaminophen 500 MG tablet  Commonly known as:  TYLENOL      TAKE these medications        fenofibrate 160 MG tablet  Take 160 mg by mouth daily.     ferrous sulfate 325 (65 FE) MG tablet  Take 1 tablet (325 mg total) by mouth 2 (two) times daily with a meal.     gabapentin 100 MG capsule  Commonly known as:  NEURONTIN  Take 200 mg by mouth 3 (three) times daily.     HYDROcodone-acetaminophen 5-325 MG per tablet  Commonly known as:  NORCO/VICODIN  Take 1-2 tablets by mouth every 6 (six) hours as needed for moderate pain.     losartan 50 MG tablet  Commonly known as:  COZAAR  Take 50 mg by mouth daily.     propranolol-hydrochlorothiazide 80-25 MG per tablet  Commonly known as:  INDERIDE  Take 0.5 tablets by mouth daily.     warfarin 5 MG tablet  Commonly known as:  COUMADIN  Take 1 tablet (5 mg total) by mouth daily at 6 PM. Take 5 mg table today 12/28 and check PT/INR in AM 12/28/2014, readjust the dose of Coumadin based on PT/INR            Follow-up Information    Follow up with Dahlia Bailiff, MD In 2 weeks.   Specialty:  Orthopedic Surgery   Why:  For suture removal, For wound re-check   Contact information:   [address] [place] [address] [phone]       Follow up with Jerlyn Ly, MD.   Specialty:  Internal Medicine   Contact information:   [address] [place] [address] [phone]        The results of significant diagnostics from this hospitalization (including imaging, microbiology, ancillary and laboratory) are listed below for reference.     Microbiology: Recent Results (from the past 240 hour(s))  Urine culture     Status: None   Collection Time: 12/23/14  9:43 PM  Result Value Ref Range Status   Specimen Description URINE, CATHETERIZED  Final   Special Requests NONE  Final   Colony Count NO  GROWTH Performed at [facility]   Final   Culture NO GROWTH Performed at [facility]   Final   Report Status 12/25/2014 FINAL  Final     Labs: Basic Metabolic Panel:  Recent Labs Lab 12/23/14 1530 12/24/14 0515 12/25/14 0527 12/26/14 0411 12/27/14 0513  NA 139 135 130* 136 137  K 3.9 3.7 3.3* 4.2 4.1  CL 107 105 103 110 112  CO2 26 24 23 23 23   GLUCOSE 122* 123* 122* 154* 126*  BUN 36* 27* 27* 34* 34*  CREATININE 1.03 0.88 1.22*  1.03 0.89  CALCIUM 9.4 8.1* 7.8* 7.7* 8.0*   Liver Function Tests:  Recent Labs Lab 12/23/14 1530  AST 28  ALT 13  ALKPHOS 43  BILITOT 0.7  PROT 6.7  ALBUMIN 3.9   CBC:  Recent Labs Lab 12/23/14 1530 12/24/14 0515 12/25/14 0527 12/25/14 1441 12/26/14 0411 12/27/14 0513  WBC 11.9* 8.0 7.8  --  8.1 7.8  NEUTROABS 10.1*  --   --   --   --   --   HGB 10.9* 8.8* 7.8* 9.5* 8.8* 9.0*  HCT 34.5* 25.7* 23.9* 30.2* 26.8* 28.2*  MCV 98.6 96.3 96.0  --  93.7 95.9  PLT 245 180 168  --  159 185   SIGNED: Time coordinating discharge: Over 30 minutes  Faye Ramsay, MD  [facility] 12/27/2014, 8:05 AM Pager [phone]  If 7PM-7AM, please contact night-coverage [email or web address] Password [email or web address]

## 2014-12-27 NOTE — Discharge Instructions (Signed)

## 2014-12-27 NOTE — Care Management Note (Signed)
CARE MANAGEMENT NOTE 12/27/2014  Patient:  Kelly Mercer, Kelly Mercer   Account Number:  [id number]  Date Initiated:  12/27/2014  Documentation initiated by:  Marney Doctor  Subjective/Objective Assessment:   80 yo admitted with hip fracture     Action/Plan:   from home alone   Anticipated DC Date:  12/27/2014   Anticipated DC Plan:  SKILLED NURSING FACILITY  In-house referral  Clinical Social Worker      DC Planning Services  CM consult      Choice offered to / List presented to:             Status of service:  Completed, signed off Medicare Important Message given?  YES (If response is "NO", the following Medicare IM given date fields will be blank) Date Medicare IM given:  12/27/2014 Medicare IM given by:  Marney Doctor Date Additional Medicare IM given:   Additional Medicare IM given by:    Discharge Disposition:    Per UR Regulation:    If discussed at Long Length of Stay Meetings, dates discussed:    Comments:  Marney Doctor RN,BSN,NCM Pt to DC to [facility]. No CM needs noted.

## 2014-12-27 NOTE — Progress Notes (Signed)
Occupational Therapy Treatment Patient Details Name: MARLISSA EMERICK MRN: [id number] DOB: [birth year] Today's Date: 12/27/2014    History of present illness 80 y.o. female hx of HTN, DM, GERD, DVT on coumadin, presented to [facility] ED after an episode of fall at home, ? Syncope but no LOC. R hip fx s/p ORIF with IM nail.   OT comments  Pt performed ADL with min A but she still needs mod A for sit to stand.    Follow Up Recommendations  SNF    Equipment Recommendations  3 in 1 bedside comode    Recommendations for Other Services      Precautions / Restrictions Precautions Precautions: Fall       Mobility Bed Mobility               General bed mobility comments: pt up in chair  Transfers   Equipment used: Rolling walker (2 wheeled) Transfers: Sit to/from Stand Sit to Stand: Mod assist         General transfer comment: cues for UE/LE placement    Balance                                   ADL       Grooming: Set up;Sitting       Lower Body Bathing: Sit to/from stand;Minimal assistance;Moderate assistance (min A for task; mod A for sit to stand)                         General ADL Comments: pt had just completed toileting ; performed bathing and changed gown. Pt wanted to perform grooming from seated level      Vision                     Perception     Praxis      Cognition   Behavior During Therapy: [facility] for tasks assessed/performed Overall Cognitive Status: Within Functional Limits for tasks assessed                       Extremity/Trunk Assessment               Exercises     Shoulder Instructions       General Comments      Pertinent Vitals/ Pain       Pain Score: 2  Pain Location: R thigh Pain Descriptors / Indicators: Sore Pain Intervention(s): Limited activity within patient's tolerance;Monitored during session;Premedicated before session;Repositioned;Ice applied  Home Living                                           Prior Functioning/Environment              Frequency Min 2X/week     Progress Toward Goals  OT Goals(current goals can now be found in the care plan section)  Progress towards OT goals: Progressing toward goals     Plan      Co-evaluation                 End of Session     Activity Tolerance Patient tolerated treatment well   Patient Left in chair;with call bell/phone within reach;with family/visitor present   Nurse Communication  Time: 1225-8346 OT Time Calculation (min): 25 min  Charges: OT General Charges $OT Visit: 1 Procedure OT Treatments $Self Care/Home Management : 23-37 mins  Marquon Alcala 12/27/2014, 9:08 AM Lesle Chris, OTR/L [phone] 12/27/2014

## 2014-12-27 NOTE — Progress Notes (Signed)
Clinical Social Work Department BRIEF PSYCHOSOCIAL ASSESSMENT 12/27/2014  Patient:  NITHILA, SUMNERS     Account Number:  [id number]     Admit date:  12/23/2014  Clinical Social Worker:  Maryln Manuel  Date/Time:  12/27/2014 11:26 AM  Referred by:  Physician  Date Referred:  12/27/2014 Referred for  SNF Placement   Other Referral:   Interview type:  Patient Other interview type:   and patient son, Hildred Pharo    PSYCHOSOCIAL DATA Living Status:  ALONE Admitted from facility:   Level of care:   Primary support name:  Kip Simic/son/[phone] Primary support relationship to patient:  CHILD, ADULT Degree of support available:   adequate    CURRENT CONCERNS Current Concerns  Post-Acute Placement   Other Concerns:    SOCIAL WORK ASSESSMENT / PLAN CSW received referral for New SNF. Per chart review, pt requesting [facility]. Per MD, pt medically ready for discharge today.    CSW met with pt at bedside. CSW confirmed with pt her [facility] and agreeable to CSW sending clinical information to facility. Pt discussed that pt son assist her with decision and pt agreeable to CSW contacting pt son via telephone to discuss.    CSW completed FL2 and initiated SNF search to [place] spoke to [facility] who confirmed bed availability today. CSW spoke with pt insurance [employer] ([employer]) and insurance provided insurance authorization for d/c today.    CSW contacted pt son via telephone and pt son states that he would like to transport pt via private vehicle. CSW spoke with PT and RN who confirmed that pt safe to transfer via private vehicle.    CSW to facilitate pt discharge needs this afternoon.   Assessment/plan status:  Psychosocial Support/Ongoing Assessment of Needs Other assessment/ plan:   discharge planning   Information/referral to community resources:   Referral to [facility] at pt request     PATIENT'S/FAMILY'S RESPONSE TO PLAN OF CARE: Pt alert and oriented x 4. Pt pleasant and actively engaged in conversation. Pt pleased that she will be able to to do short term rehab at [facility] is for pt to transition to [facility] today.   Alison Murray, MSW, [place] Work [phone]

## 2014-12-27 NOTE — Progress Notes (Signed)
Pt for discharge to [facility].  CSW facilitated pt discharge needs including contacting facility, faxing pt discharge information via [facility], discussing with pt at bedside and pt son via telephone, providing RN phone number to call report, provided discharge packet at bedside for pt and pt son to provide to [place] upon arrival to facility. Pt son plans to transport pt via private vehicle.  [employer] Medicare HMO Gold provided insurance authorization to [place] for SNF.  Pt coping appropriately with transition to [facility] for short term rehab. Pt appreciative of hospital staff assistance during stay and has been very pleased with her care.   No further social work needs identified at this time.  CSW signing off.   Alison Murray, MSW, [place] Work [phone]

## 2014-12-27 NOTE — Progress Notes (Signed)
Clinical Social Work Department CLINICAL SOCIAL WORK PLACEMENT NOTE 12/27/2014  Patient:  SUNG, RENTON  Account Number:  [id number] Admit date:  12/23/2014  Clinical Social Worker:  Maryln Manuel  Date/time:  12/27/2014 11:33 AM  Clinical Social Work is seeking post-discharge placement for this patient at the following level of care:   [place]   (*CSW will update this form in Epic as items are completed)   12/27/2014  Patient/family provided with [place] Department of Clinical Social Work's list of facilities offering this level of care within the geographic area requested by the patient (or if unable, by the patient's family).  12/27/2014  Patient/family informed of their freedom to choose among providers that offer the needed level of care, that participate in Medicare, Medicaid or managed care program needed by the patient, have an available bed and are willing to accept the patient.  12/27/2014  Patient/family informed of [facility]' ownership interest in [facility], as well as of the fact that they are under no obligation to receive care at this facility.  PASARR submitted to EDS on 12/27/2014 PASARR number received on 12/27/2014  FL2 transmitted to all facilities in geographic area requested by pt/family on  12/27/2014 FL2 transmitted to all facilities within larger geographic area on   Patient informed that his/her managed care company has contracts with or will negotiate with  certain facilities, including the following:     Patient/family informed of bed offers received:  12/27/2014 Patient chooses bed at [facility] Physician recommends and patient chooses bed at    Patient to be transferred to [facility] on  12/27/2014 Patient to be transferred to facility by pt son via private vehicle Patient and family notified of transfer on 12/27/2014 Name of family member notified:  pt and pt son  notified at bedside.  The following physician request were entered in Epic:   Additional Comments:   Alison Murray, MSW, [place] Work [phone]

## 2015-12-06 ENCOUNTER — Encounter (HOSPITAL_COMMUNITY): Payer: Self-pay

## 2015-12-06 ENCOUNTER — Other Ambulatory Visit (HOSPITAL_COMMUNITY): Payer: Self-pay | Admitting: Internal Medicine

## 2015-12-06 ENCOUNTER — Ambulatory Visit (HOSPITAL_COMMUNITY)
Admission: RE | Admit: 2015-12-06 | Discharge: 2015-12-06 | Disposition: A | Discharge status: Home / Self Care | Payer: Commercial Managed Care - HMO | Source: Ambulatory Visit | Attending: Internal Medicine | Admitting: Internal Medicine

## 2015-12-06 DIAGNOSIS — M810 Age-related osteoporosis without current pathological fracture: Secondary | ICD-10-CM | POA: Insufficient documentation

## 2015-12-06 MED ORDER — ZOLEDRONIC ACID 5 MG/100ML IV SOLN
5.0000 mg | Freq: Once | INTRAVENOUS | Status: AC
  Administered 2015-12-06: 5 mg via INTRAVENOUS
  Filled 2015-12-06: qty 100

## 2015-12-06 MED ORDER — SODIUM CHLORIDE 0.9 % IV SOLN
Freq: Once | INTRAVENOUS | Status: AC
  Administered 2015-12-06: 14:00:00 via INTRAVENOUS

## 2015-12-06 NOTE — Discharge Instructions (Signed)

## 2016-10-24 ENCOUNTER — Encounter (HOSPITAL_COMMUNITY): Payer: Self-pay | Admitting: Emergency Medicine

## 2016-10-24 ENCOUNTER — Emergency Department (HOSPITAL_COMMUNITY): Payer: Commercial Managed Care - HMO

## 2016-10-24 ENCOUNTER — Emergency Department (HOSPITAL_COMMUNITY)
Admission: EM | Admit: 2016-10-24 | Discharge: 2016-10-24 | Disposition: A | Discharge status: Home / Self Care | Payer: Commercial Managed Care - HMO | Attending: Emergency Medicine | Admitting: Emergency Medicine

## 2016-10-24 DIAGNOSIS — I10 Essential (primary) hypertension: Secondary | ICD-10-CM | POA: Diagnosis not present

## 2016-10-24 DIAGNOSIS — E119 Type 2 diabetes mellitus without complications: Secondary | ICD-10-CM | POA: Diagnosis not present

## 2016-10-24 DIAGNOSIS — Z79899 Other long term (current) drug therapy: Secondary | ICD-10-CM | POA: Diagnosis not present

## 2016-10-24 DIAGNOSIS — R5383 Other fatigue: Secondary | ICD-10-CM | POA: Diagnosis present

## 2016-10-24 DIAGNOSIS — D72829 Elevated white blood cell count, unspecified: Secondary | ICD-10-CM | POA: Insufficient documentation

## 2016-10-24 DIAGNOSIS — R52 Pain, unspecified: Secondary | ICD-10-CM

## 2016-10-24 DIAGNOSIS — K5900 Constipation, unspecified: Secondary | ICD-10-CM | POA: Insufficient documentation

## 2016-10-24 LAB — BASIC METABOLIC PANEL
Anion gap: 7 (ref 5–15)
BUN: 38 mg/dL — ABNORMAL HIGH (ref 6–20)
CO2: 23 mmol/L (ref 22–32)
Calcium: 9.8 mg/dL (ref 8.9–10.3)
Chloride: 105 mmol/L (ref 101–111)
Creatinine, Ser: 1.53 mg/dL — ABNORMAL HIGH (ref 0.44–1.00)
GFR calc Af Amer: 34 mL/min — ABNORMAL LOW (ref >60)
GFR calc non Af Amer: 29 mL/min — ABNORMAL LOW (ref >60)
GLUCOSE: 125 mg/dL — AB (ref 65–99)
POTASSIUM: 3.7 mmol/L (ref 3.5–5.1)
SODIUM: 135 mmol/L (ref 135–145)

## 2016-10-24 LAB — CBG MONITORING, ED: Glucose-Capillary: 121 mg/dL — ABNORMAL HIGH (ref 65–99)

## 2016-10-24 LAB — URINALYSIS, ROUTINE W REFLEX MICROSCOPIC
Bilirubin Urine: NEGATIVE
Glucose, UA: NEGATIVE mg/dL
Hgb urine dipstick: NEGATIVE
Ketones, ur: NEGATIVE mg/dL
LEUKOCYTES UA: NEGATIVE
NITRITE: NEGATIVE
PH: 5.5 (ref 5.0–8.0)
Protein, ur: NEGATIVE mg/dL
SPECIFIC GRAVITY, URINE: 1.023 (ref 1.005–1.030)

## 2016-10-24 LAB — I-STAT CG4 LACTIC ACID, ED: Lactic Acid, Venous: 0.84 mmol/L (ref 0.5–1.9)

## 2016-10-24 LAB — CBC
HEMATOCRIT: 35.8 % — AB (ref 36.0–46.0)
Hemoglobin: 11.9 g/dL — ABNORMAL LOW (ref 12.0–15.0)
MCH: 30.6 pg (ref 26.0–34.0)
MCHC: 33.2 g/dL (ref 30.0–36.0)
MCV: 92 fL (ref 78.0–100.0)
Platelets: 292 10*3/uL (ref 150–400)
RBC: 3.89 MIL/uL (ref 3.87–5.11)
RDW: 13.5 % (ref 11.5–15.5)
WBC: 18.6 10*3/uL — AB (ref 4.0–10.5)

## 2016-10-24 MED ORDER — IOPAMIDOL (ISOVUE-300) INJECTION 61%
30.0000 mL | Freq: Once | INTRAVENOUS | Status: AC | PRN
  Administered 2016-10-24: 15 mL via ORAL

## 2016-10-24 MED ORDER — SODIUM CHLORIDE 0.9 % IV BOLUS (SEPSIS)
1000.0000 mL | Freq: Once | INTRAVENOUS | Status: AC
  Administered 2016-10-24: 1000 mL via INTRAVENOUS

## 2016-10-24 NOTE — ED Provider Notes (Signed)
[place] DEPT Provider Note   CSN: [id number] Arrival date & time: 10/24/16  1207     History   Chief Complaint Chief Complaint  Patient presents with  . Hypotension  . Fatigue    HPI Kelly Mercer is a 82 y.o. female with history of depression, diabetes, GERD who presents with a one-week history of lethargy, decreased appetite and a record of hypotension at home that prompted PCP to send her here today. Patient's caregiver reports that the patient has been coughing over the past week. The caregiver also reports that the patient has had chills. She had a temperature of 100 today. The patient denies any chest pain, shortness of breath. The patient reports that she has a fullness sensation in her abdomen. She denies abdominal pain. Patient's last bowel movement was this morning and was hard. She denies any blood in her stool. She denies any urinary symptoms, nausea, vomiting.  HPI  Past Medical History:  Diagnosis Date  . Carotid stenosis    right  . Clotting disorder ([place])   . Dermoid tumor    arm  . Diabetes mellitus   . DVT (deep venous thrombosis) ([place]) 04/30/2013  . GERD (gastroesophageal reflux disease)   . Hearing loss   . Hypertension   . Osteoarthritis    r knee  . Osteopenia   . Peripheral neuropathy [facility])     Patient Active Problem List   Diagnosis Date Noted  . Hip fracture ([place]) 12/23/2014  . Fall   . Bright red blood per rectum 05/10/2013  . DVT (deep venous thrombosis) ([place]) 05/10/2013  . Hypertension 05/10/2013  . Diabetes mellitus ([place]) 05/10/2013    Past Surgical History:  Procedure Laterality Date  . ABDOMINAL HYSTERECTOMY    . cataracts Bilateral   . CHOLECYSTECTOMY    . desmoid tumor resection    . FEMUR IM NAIL Right 12/23/2014   Procedure: INTRAMEDULLARY (IM) NAIL FEMORAL;  Surgeon: Melina Schools, MD;  Location: [facility] ORS;  Service: Orthopedics;  Laterality: Right;  . IVC filter  05/11/13    OB History    No data available        Home Medications    Prior to Admission medications   Medication Sig Start Date End Date Taking? Authorizing Provider  Cholecalciferol (VITAMIN D3) 2000 UNITS TABS Take 2,000 Units by mouth daily.    Historical Provider, MD  fenofibrate 160 MG tablet Take 160 mg by mouth daily.      Historical Provider, MD  ferrous sulfate 325 (65 FE) MG tablet Take 1 tablet (325 mg total) by mouth 2 (two) times daily with a meal. 12/27/14   Theodis Blaze, MD  gabapentin (NEURONTIN) 100 MG capsule Take 200 mg by mouth 3 (three) times daily.     Historical Provider, MD  HYDROcodone-acetaminophen (NORCO/VICODIN) 5-325 MG per tablet Take 1-2 tablets by mouth every 6 (six) hours as needed for moderate pain. 12/27/14   Theodis Blaze, MD  losartan (COZAAR) 50 MG tablet Take 50 mg by mouth daily.     Historical Provider, MD  propranolol-hydrochlorothiazide (INDERIDE) 80-25 MG per tablet Take 0.5 tablets by mouth daily.     Historical Provider, MD  rivastigmine (EXELON) 4.6 mg/24hr Place 4.6 mg onto the skin daily.    Historical Provider, MD  warfarin (COUMADIN) 5 MG tablet Take 1 tablet (5 mg total) by mouth daily at 6 PM. Take 5 mg table today 12/28 and check PT/INR in AM 12/28/2014, readjust the dose of Coumadin  based on PT/INR 12/27/14   Theodis Blaze, MD    Family History Family History  Problem Relation Age of Onset  . Diabetes Mother   . Stroke Father     Social History Social History  Substance Use Topics  . Smoking status: Never Smoker  . Smokeless tobacco: Not on file  . Alcohol use No     Allergies   Chocolate   Review of Systems Review of Systems  Constitutional: Positive for appetite change, chills and fatigue. Negative for fever.  HENT: Negative for facial swelling and sore throat.   Respiratory: Positive for cough. Negative for shortness of breath.   Cardiovascular: Negative for chest pain.  Gastrointestinal: Positive for constipation (hx of hard stools). Negative for abdominal  pain, diarrhea, nausea and vomiting.  Genitourinary: Negative for dysuria.  Musculoskeletal: Negative for back pain.  Skin: Negative for rash and wound.  Neurological: Negative for headaches.  Psychiatric/Behavioral: The patient is not nervous/anxious.      Physical Exam Updated Vital Signs BP 104/70   Pulse 69   Temp 98.3 F (36.8 C) (Oral)   Resp 16   Wt 69.4 kg   SpO2 99%   BMI 25.46 kg/m   Physical Exam  Constitutional: She appears well-developed and well-nourished. No distress.  HENT:  Head: Normocephalic and atraumatic.  Mouth/Throat: Oropharynx is clear and moist. No oropharyngeal exudate.  Eyes: Conjunctivae are normal. Pupils are equal, round, and reactive to light. Right eye exhibits no discharge. Left eye exhibits no discharge. No scleral icterus.  Neck: Normal range of motion. Neck supple. No thyromegaly present.  Cardiovascular: Normal rate, regular rhythm, normal heart sounds and intact distal pulses.  Exam reveals no gallop and no friction rub.   No murmur heard. Pulmonary/Chest: Effort normal. No stridor. No tachypnea. No respiratory distress. She has no wheezes. She has rhonchi (R lung). She has no rales.  Abdominal: Soft. Bowel sounds are normal. She exhibits no distension. There is no tenderness. There is no rebound and no guarding.  Musculoskeletal: She exhibits no edema.  Lymphadenopathy:    She has no cervical adenopathy.  Neurological: She is alert. Coordination normal.  Skin: Skin is warm and dry. No rash noted. She is not diaphoretic. No pallor.  Psychiatric: She has a normal mood and affect.  Nursing note and vitals reviewed.    ED Treatments / Results  Labs (all labs ordered are listed, but only abnormal results are displayed) Labs Reviewed  BASIC METABOLIC PANEL - Abnormal; Notable for the following:       Result Value   Glucose, Bld 125 (*)    BUN 38 (*)    Creatinine, Ser 1.53 (*)    GFR calc non Af Amer 29 (*)    GFR calc Af Amer 34  (*)    All other components within normal limits  CBC - Abnormal; Notable for the following:    WBC 18.6 (*)    Hemoglobin 11.9 (*)    HCT 35.8 (*)    All other components within normal limits  URINALYSIS, ROUTINE W REFLEX MICROSCOPIC (NOT AT [facility]) - Abnormal; Notable for the following:    Color, Urine AMBER (*)    All other components within normal limits  CBG MONITORING, ED - Abnormal; Notable for the following:    Glucose-Capillary 121 (*)    All other components within normal limits  I-STAT CG4 LACTIC ACID, ED    EKG  EKG Interpretation  Date/Time:  Wednesday October 24 2016 12:52:12  EDT Ventricular Rate:  68 PR Interval:    QRS Duration: 88 QT Interval:  361 QTC Calculation: 384 R Axis:   52 Text Interpretation:  Sinus rhythm Borderline prolonged PR interval since last tracing no significant change Confirmed by Sabra Heck  MD, BRIAN ([id number]) on 10/24/2016 2:27:02 PM       Radiology Dg Chest 2 View  Result Date: 10/24/2016 CLINICAL DATA:  Hypotension and lethargy EXAM: CHEST  2 VIEW COMPARISON:  12/23/2014 FINDINGS: Cardiac shadow is at the upper limits of normal in size. The lungs are well aerated bilaterally. No focal infiltrate or sizable effusion is seen. No acute bony abnormality is noted. IMPRESSION: No acute abnormality seen. Electronically Signed   By: Inez Catalina M.D.   On: 10/24/2016 14:27    Procedures Procedures (including critical care time)  Medications Ordered in ED Medications  iopamidol (ISOVUE-300) 61 % injection 30 mL (15 mLs Oral Contrast Given 10/24/16 1525)  sodium chloride 0.9 % bolus 1,000 mL (1,000 mLs Intravenous New Bag/Given 10/24/16 1548)     Initial Impression / Assessment and Plan / ED Course  I have reviewed the triage vital signs and the nursing notes.  Pertinent labs & imaging results that were available during my care of the patient were reviewed by me and considered in my medical decision making (see chart for  details).  Clinical Course   CBC shows WBC 18.6, hemoglobin 11.9. BMP shows glucose 125, BUN 38, creatinine 1.3. Lactate 0.84. UA negative. CXR shows no active cardiopulmonary disease. EKG shows NSR, borderline prolonged PR interval revealed; no significant change since last tracing. CT abdomen and pelvis pending and search for cause of infection, leukocytosis. Myself and Dr. Sabra Heck agreed that with patient's stable vitals and well-appearing exam, if negative CT abdomen pelvis, patient can be discharged with follow-up to PCP. At shift change, patient care transferred to [facility], PA-C for continued evaluation, follow up of CT abdomen pelvis and determination of disposition. Anticipate discharge if CT negative. Patient also evaluated by Dr. Sabra Heck who guided the patient's management and agrees with plan.    Final Clinical Impressions(s) / ED Diagnoses   Final diagnoses:  Lethargy  Leukocytosis, unspecified type    New Prescriptions New Prescriptions   No medications on file     Frederica Kuster, PA-C 10/24/16 1615    [name], MD 10/26/16 1121

## 2016-10-24 NOTE — ED Provider Notes (Signed)
The patient is an 82 year old female, she is very pleasant, she does not have any specific complaints however her caregiver and family member who accompany her states that over the last week she has had a progressive decline in her appetite and fatigue, there is no other specific complaints other than she feels she has not had a good bowel movement in 24 hours. On exam the patient has clear heart sounds, clear lung sounds, normal vital signs without any tachycardia bradycardia hypotension or fever. She has a soft abdomen with no focal guarding or tenderness or masses, there is no tympanitic sounds to percussion, she has no edema rashes or any abnormal mental status. Labs show that she has a leukocytosis, she has been complaining of some abdominal discomfort, she will undergo CT scanning to further evaluate intra-abdominal structures which could be causing her symptoms.  Medical screening examination/treatment/procedure(s) were conducted as a shared visit with non-physician practitioner(s) and myself.  I personally evaluated the patient during the encounter.  Clinical Impression:   Final diagnoses:  Lethargy  Leukocytosis, unspecified type         [name], MD 10/26/16 1121

## 2016-10-24 NOTE — ED Notes (Signed)
Bed: [facility] Expected date:  Expected time:  Means of arrival:  Comments: 

## 2016-10-24 NOTE — ED Notes (Signed)
Patient c/o weakness that is getting worse over last several days.  She denies N/V/D and fever.  Patient is alert and oriented.  No LE noted.  Lung sounds diminished/clear.  Abdomen soft and non-tender to palpation.

## 2016-10-24 NOTE — ED Notes (Signed)
Bed: [id number] Expected date:  Expected time:  Means of arrival:  Comments: TR 1

## 2016-10-24 NOTE — ED Triage Notes (Signed)
Pts family states they checked pt BP this morning and it was low this morning. Pt adds she has been very lethargic and refusing to eat these past few days. States she takes depression medications. Pts son adds he thinks he pulse is a lot weaker than usual. Pt states "They dragged me here, I feel fine." Pt reluctant to answer any questions. Denies any pain. Just states "I'm just tired"

## 2016-10-24 NOTE — Discharge Instructions (Signed)
There were some abnormalities noted on your labs including a higher than normal white blood cell count, but no source of infection or other reason for this elevation was found. There were also signs of dehydration on your labs.  The only abnormality found on the abdominal CT was an abdominal aortic aneurysm measuring 3 cm. It is recommended that this area is re-imaged by ultrasound in 3 years. No other further action needs to be taken on this matter.  Follow-up with your primary care provider for chronic management of your complaints.

## 2016-10-24 NOTE — ED Notes (Signed)
Patient resting comfortably on stretcher, son and caregiver remain at bedside.

## 2016-10-24 NOTE — ED Provider Notes (Signed)
Kelly Mercer is a 82 y.o. female presenting to the ED with reports of lethargy, decreased appetite, and minor hypotension over the last week.   HPI from Kelly Mccoy, Kelly Mercer: "Kelly Mercer is a 82 y.o. female with history of depression, diabetes, GERD who presents with a one-week history of lethargy, decreased appetite and a record of hypotension at home that prompted PCP to send her here today. Patient's caregiver reports that the patient has been coughing over the past week. The caregiver also reports that the patient has had chills. She had a temperature of 100 today. The patient denies any chest pain, shortness of breath. The patient reports that she has a fullness sensation in her abdomen. She denies abdominal pain. Patient's last bowel movement was this morning and was hard. She denies any blood in her stool. She denies any urinary symptoms, nausea, vomiting."   Physical Exam  BP 104/70   Pulse 69   Temp 98.3 F (36.8 C) (Oral)   Resp 16   Wt 69.4 kg   SpO2 99%   BMI 25.46 kg/m   Physical Exam  Constitutional: She appears well-developed and well-nourished. No distress.  HENT:  Head: Normocephalic and atraumatic.  Eyes: Conjunctivae are normal.  Neck: Neck supple.  Cardiovascular: Normal rate, regular rhythm, normal heart sounds and intact distal pulses.   Pulmonary/Chest: Effort normal and breath sounds normal. No respiratory distress.  Abdominal: Soft. Bowel sounds are normal. She exhibits no distension. There is no tenderness. There is no guarding.  Musculoskeletal: She exhibits no edema or tenderness.  Lymphadenopathy:    She has no cervical adenopathy.  Neurological: She is alert.  Skin: Skin is warm and dry. She is not diaphoretic.  Psychiatric: She has a normal mood and affect. Her behavior is normal.  Nursing note and vitals reviewed.   ED Course  Procedures  Results for orders placed or performed during the hospital encounter of [id number]  Basic metabolic  panel  Result Value Ref Range   Sodium 135 135 - 145 mmol/L   Potassium 3.7 3.5 - 5.1 mmol/L   Chloride 105 101 - 111 mmol/L   CO2 23 22 - 32 mmol/L   Glucose, Bld 125 (H) 65 - 99 mg/dL   BUN 38 (H) 6 - 20 mg/dL   Creatinine, Ser 1.53 (H) 0.44 - 1.00 mg/dL   Calcium 9.8 8.9 - 10.3 mg/dL   GFR calc non Af Amer 29 (L) >60 mL/min   GFR calc Af Amer 34 (L) >60 mL/min   Anion gap 7 5 - 15  CBC  Result Value Ref Range   WBC 18.6 (H) 4.0 - 10.5 K/uL   RBC 3.89 3.87 - 5.11 MIL/uL   Hemoglobin 11.9 (L) 12.0 - 15.0 g/dL   HCT 35.8 (L) 36.0 - 46.0 %   MCV 92.0 78.0 - 100.0 fL   MCH 30.6 26.0 - 34.0 pg   MCHC 33.2 30.0 - 36.0 g/dL   RDW 13.5 11.5 - 15.5 %   Platelets 292 150 - 400 K/uL  Urinalysis, Routine w reflex microscopic  Result Value Ref Range   Color, Urine AMBER (A) YELLOW   APPearance CLEAR CLEAR   Specific Gravity, Urine 1.023 1.005 - 1.030   pH 5.5 5.0 - 8.0   Glucose, UA NEGATIVE NEGATIVE mg/dL   Hgb urine dipstick NEGATIVE NEGATIVE   Bilirubin Urine NEGATIVE NEGATIVE   Ketones, ur NEGATIVE NEGATIVE mg/dL   Protein, ur NEGATIVE NEGATIVE mg/dL   Nitrite NEGATIVE NEGATIVE  Leukocytes, UA NEGATIVE NEGATIVE  CBG monitoring, ED  Result Value Ref Range   Glucose-Capillary 121 (H) 65 - 99 mg/dL  I-Stat CG4 Lactic Acid, ED  Result Value Ref Range   Lactic Acid, Venous 0.84 0.5 - 1.9 mmol/L   Ct Abdomen Pelvis Wo Contrast  Result Date: 10/24/2016 CLINICAL DATA:  Coughing over the past week.  Shortness of breath. EXAM: CT ABDOMEN AND PELVIS WITHOUT CONTRAST TECHNIQUE: Multidetector CT imaging of the abdomen and pelvis was performed following the standard protocol without IV contrast. COMPARISON:  None. FINDINGS: Lower chest: Bibasilar mild chronic interstitial disease. Hepatobiliary: No focal liver abnormality is seen. Status post cholecystectomy. No biliary dilatation. Pancreas: Unremarkable. No pancreatic ductal dilatation or surrounding inflammatory changes. Spleen: Normal  in size without focal abnormality. Adrenals/Urinary Tract: Normal adrenal glands. No urolithiasis or obstructive uropathy. 6.9 cm hypodense, fluid attenuating left renal mass most consistent with a cyst. Small left parapelvic cysts. Small right bladder base diverticulum. Stomach/Bowel: Stomach is within normal limits. Sigmoid diverticulosis without evidence of diverticulitis. No evidence of bowel wall thickening, distention, or inflammatory changes. Vascular/Lymphatic: Abdominal aortic atherosclerosis. Infrarenal abdominal aortic aneurysm measuring 3 cm in greatest AP diameter. IVC filter with the tip below the level of the renal veins. Reproductive: Status post hysterectomy. No adnexal masses. Other: No abdominal wall hernia or abnormality. No abdominopelvic ascites. Musculoskeletal: No acute osseous abnormality. No aggressive lytic or sclerotic osseous lesion. Bilateral facet arthropathy L4-5 and L5-S1. Severe osteoarthritis of the right hip. Prior intertrochanteric right hip fracture transfixed with an intra medullary nail and interlocking femoral neck screw. Mild osteoarthritis of the left hip. IMPRESSION: 1. No acute abdominal or pelvic pathology. 2. Diverticulosis without evidence of diverticulitis. 3. Infrarenal abdominal aortic aneurysm measuring 3 cm in AP diameter. Recommend followup by ultrasound in 3 years. This recommendation follows ACR consensus guidelines: White Paper of the ACR Incidental Findings Committee II on Vascular Findings. Kelly Mercer 2013; [id number] Electronically Signed   By: Kelly Mercer   On: 10/24/2016 18:34   Dg Chest 2 View  Result Date: 10/24/2016 CLINICAL DATA:  Hypotension and lethargy EXAM: CHEST  2 VIEW COMPARISON:  12/23/2014 FINDINGS: Cardiac shadow is at the upper limits of normal in size. The lungs are well aerated bilaterally. No focal infiltrate or sizable effusion is seen. No acute bony abnormality is noted. IMPRESSION: No acute abnormality seen. Electronically  Signed   By: Kelly Catalina M.D.   On: 10/24/2016 14:27    MDM  Took patient care handoff report from [facility], Kelly Mercer. Plan: Abd CT. If clear, d/c home.  Upon my evaluation, patient denies any complaints. 3 cm abdominal aortic aneurysm noted on CT. Follow-up instructions given to the patient. No other significant findings noted on the CT. Patient to follow up with PCP. Return precautions discussed.   Vitals:   10/24/16 1500 10/24/16 1530 10/24/16 1700 10/24/16 1708  BP: (!) 128/49 104/70 (!) 142/47 (!) 142/47  Pulse: 69   70  Resp: 18 16 16 18   Temp:      TempSrc:      SpO2: 98% 99%  94%  Weight:       Vitals:   10/24/16 1530 10/24/16 1700 10/24/16 1708 10/24/16 1911  BP: 104/70 (!) 142/47 (!) 142/47 (!) 136/103  Pulse:   70 68  Resp: 16 16 18 19   Temp:      TempSrc:      SpO2: 99%  94% 98%  Weight:  Kelly Bender, Kelly Mercer 10/25/16 [place], Kelly Mercer 10/25/16 [phone]

## 2016-12-16 ENCOUNTER — Emergency Department (HOSPITAL_COMMUNITY)
Admission: EM | Admit: 2016-12-16 | Discharge: 2016-12-31 | Disposition: E | Payer: Commercial Managed Care - HMO | Attending: Emergency Medicine | Admitting: Emergency Medicine

## 2016-12-16 DIAGNOSIS — I469 Cardiac arrest, cause unspecified: Secondary | ICD-10-CM | POA: Diagnosis present

## 2016-12-16 LAB — CBG MONITORING, ED: GLUCOSE-CAPILLARY: 257 mg/dL — AB (ref 65–99)

## 2016-12-16 MED ORDER — EPINEPHRINE PF 1 MG/10ML IJ SOSY
PREFILLED_SYRINGE | INTRAMUSCULAR | Status: AC | PRN
  Administered 2016-12-16 – 2016-12-17 (×4): 1 via INTRAVENOUS

## 2016-12-16 MED ORDER — SODIUM BICARBONATE 8.4 % IV SOLN
INTRAVENOUS | Status: AC | PRN
  Administered 2016-12-16 – 2016-12-17 (×3): 100 meq via INTRAVENOUS

## 2016-12-16 NOTE — ED Provider Notes (Signed)
By signing my name below, I, Joshua Fowler, attest that this documentation has been prepared under the direction and in the presence of Kristen N Ward, DO . Electronically Signed: Joshua Fowler, Scribe. 12/14/2016. 11:56 PM.  TIME SEEN: 11:56PM  CHIEF COMPLAINT: CPR  HPI:  Kelly Mercer is a 82 y.o. female on coumadin who presents to the Emergency Department by ambulance in cardiac arrest. Per EMS, pt was found in arrest on the toilet with blood in her stool at her nursing facility.  EMS states patient was pulseless on their arrival. The fire department was performing CPR. Unsure if bystander CPR was started. EMS states that after they performed 20 minutes of CPR they got pulses back (23:18), but lost them again about 8 minutes PTA (23:45). EMS state the pt was in a sinus bradycardia and was being paced and then went into asystole when they lost pulses. Pt has received 4 epi en route with the most recent being greater than 5 minutes PTA. They have not seen PEA, ventricular fibrillation, ventricular tachycardia. Blood sugar was in the 280s with EMS. They were unable to place endotracheal tube. They used a cric kit to place airway and have bilateral breath sounds although minimal and chest rise.   ROS: Level V caveat for cardiac arrest  PAST MEDICAL HISTORY/PAST SURGICAL HISTORY:  No past medical history on file.  MEDICATIONS:  Prior to Admission medications   Not on File    ALLERGIES:  Allergies  Allergen Reactions  . Chocolate Other (See Comments)    Acne from too much    SOCIAL HISTORY:  Social History  Substance Use Topics  . Smoking status: Not on file  . Smokeless tobacco: Not on file  . Alcohol use Not on file    FAMILY HISTORY: No family history on file.  EXAM: BP (!) 129/22   Temp (!) 95.4 F (35.2 C) (Rectal)   Resp (!) 34   Wt 120 lb (54.4 kg)  CONSTITUTIONAL: Elderly, GCS 3, and active cardiac arrest HEAD: Normocephalic; atraumatic EYES: Pupils are fixed  and dilated ENT: normal nose; no rhinorrhea; brownish discharge coming from her mouth and nose, cric in midline neck with some associated swelling but no ecchymosis NECK:  trachea midline CARD: In asystole; no pulses when CPR is not being done RESP: Minimal chest rise and minimal aeration heard diffusely CHEST:  chest wall stable, no crepitus or ecchymosis or deformity, nontender to palpation; no flail chest ABD/GI: Nondistended RECTAL:  There is melena noted on her underwear but no active hemorrhage BACK:  The back appears normal  EXT: Cold touch, no significant edema, no deformity of the extremities SKIN: Cool to touch NEURO: GCS 3   MEDICAL DECISION MAKING: Patient here in cardiac arrest. EMS reports received approximately 20 minutes of CPR before return of spontaneous circulation and then lost pulses again approximately 10 minutes prior to arrival. Patient is currently in asystole with no signs of life. Pupils are fixed and dilated. GCS 3. Capnography 5. Patient given another several rounds of epinephrine, calcium, bicarbonate without any change. Bedside ultrasound performed in no significant cardiac motion noted.  I did replace the cric with an endotracheal tube. She had good breath sounds and good chest rise afterwards. During CPR patient would have good strong palpable femoral and radial pulses that were immediately lost when compressions stopped. Time of death called at 12:14 AM. It was thought at this time further resuscitation would be futile. Patient's son and daughter-in-law updated with chaplain.   Discussed with Dr. Virgina Jock who is on-call for patient's PCP Dr. Joylene Draft to complete the death certificate.           Cardiopulmonary Resuscitation (CPR) Procedure Note Directed/Performed by: Nyra Jabs I personally directed ancillary staff and/or performed CPR in an effort to regain return of spontaneous circulation and to maintain cardiac, neuro and systemic perfusion.       INTUBATION Performed by: Nyra Jabs  Required items: required blood products, implants, devices, and special equipment available Patient identity confirmed: provided demographic data and hospital-assigned identification number Time out: Immediately prior to procedure a "time out" was called to verify the correct patient, procedure, equipment, support staff and site/side marked as required.  Indications:  cardiac arrest  Intubation method: Glidescope Laryngoscopy   Preoxygenation: Cric placed by EMS  Sedatives: None Paralytic: None  Tube Size: 7.5 cuffed  Post-procedure assessment: chest rise and ETCO2 monitor Breath sounds: equal and absent over the epigastrium Tube secured with: ETT holder   Patient tolerated the procedure well with no immediate complications.      CRITICAL CARE Performed by: Nyra Jabs   Total critical care time: 30 minutes  Critical care time was exclusive of separately billable procedures and treating other patients.  Critical care was necessary to treat or prevent imminent or life-threatening deterioration.  Critical care was time spent personally by me on the following activities: development of treatment plan with patient and/or surrogate as well as nursing, discussions with consultants, evaluation of patient's response to treatment, examination of patient, obtaining history from patient or surrogate, ordering and performing treatments and interventions, ordering and review of laboratory studies, ordering and review of radiographic studies, pulse oximetry and re-evaluation of patient's condition.    [place], DO Jan 10, 2017 0211

## 2016-12-17 ENCOUNTER — Encounter (HOSPITAL_COMMUNITY): Payer: Self-pay | Admitting: Emergency Medicine

## 2016-12-17 MED ORDER — SODIUM BICARBONATE 8.4 % IV SOLN
INTRAVENOUS | Status: AC
  Filled 2016-12-17: qty 100

## 2016-12-17 MED ORDER — SODIUM CHLORIDE 0.9 % IV SOLN
INTRAVENOUS | Status: DC | PRN
  Administered 2016-12-16: 2000 mL via INTRAVENOUS

## 2016-12-17 MED FILL — Medication: Qty: 1 | Status: AC

## 2016-12-31 NOTE — Code Documentation (Signed)
Patient time of death occurred at 30

## 2016-12-31 NOTE — ED Triage Notes (Addendum)
Pt arrived EMS from facility stating pt was found on the toilet unresponsive with blood on the toilet. Per EMS CPR was started on scene, ROSC was achieved at 2318 and pt was in asystole at 2345 4 of epi given total en route

## 2016-12-31 NOTE — Code Documentation (Signed)
Pt has an IO to the RLL placed by EMS

## 2016-12-31 DEATH — deceased
